# Patient Record
Sex: Female | Born: 1960
Health system: Southern US, Community
[De-identification: ages and names within clinical notes are randomized; demographics above are authoritative.]

## PROBLEM LIST (undated history)

## (undated) DIAGNOSIS — J45909 Unspecified asthma, uncomplicated: Secondary | ICD-10-CM

## (undated) DIAGNOSIS — D126 Benign neoplasm of colon, unspecified: Secondary | ICD-10-CM

## (undated) DIAGNOSIS — I639 Cerebral infarction, unspecified: Secondary | ICD-10-CM

## (undated) DIAGNOSIS — M1990 Unspecified osteoarthritis, unspecified site: Secondary | ICD-10-CM

## (undated) DIAGNOSIS — F32A Depression, unspecified: Secondary | ICD-10-CM

## (undated) DIAGNOSIS — F419 Anxiety disorder, unspecified: Secondary | ICD-10-CM

## (undated) HISTORY — DX: Unspecified osteoarthritis, unspecified site: M19.90

## (undated) HISTORY — DX: Cerebral infarction, unspecified: I63.9

## (undated) HISTORY — DX: Unspecified asthma, uncomplicated: J45.909

## (undated) HISTORY — DX: Benign neoplasm of colon, unspecified: D12.6

## (undated) HISTORY — DX: Anxiety disorder, unspecified: F41.9

## (undated) HISTORY — DX: Depression, unspecified: F32.A

---
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
## 1987-06-15 DIAGNOSIS — I639 Cerebral infarction, unspecified: Secondary | ICD-10-CM

## 1987-06-15 HISTORY — DX: Cerebral infarction, unspecified: I63.9

## 1998-03-19 ENCOUNTER — Other Ambulatory Visit: Admission: RE | Admit: 1998-03-19 | Discharge: 1998-03-19 | Payer: Self-pay | Admitting: Obstetrics and Gynecology

## 1999-05-21 ENCOUNTER — Other Ambulatory Visit: Admission: RE | Admit: 1999-05-21 | Discharge: 1999-05-21 | Payer: Self-pay | Admitting: Obstetrics & Gynecology

## 2000-09-28 ENCOUNTER — Other Ambulatory Visit: Admission: RE | Admit: 2000-09-28 | Discharge: 2000-09-28 | Payer: Self-pay | Admitting: Obstetrics & Gynecology

## 2001-06-23 ENCOUNTER — Other Ambulatory Visit: Admission: RE | Admit: 2001-06-23 | Discharge: 2001-06-23 | Payer: Self-pay | Admitting: Obstetrics & Gynecology

## 2002-07-18 ENCOUNTER — Other Ambulatory Visit: Admission: RE | Admit: 2002-07-18 | Discharge: 2002-07-18 | Payer: Self-pay | Admitting: Obstetrics & Gynecology

## 2003-08-22 ENCOUNTER — Other Ambulatory Visit: Admission: RE | Admit: 2003-08-22 | Discharge: 2003-08-22 | Payer: Self-pay | Admitting: Obstetrics & Gynecology

## 2004-09-14 ENCOUNTER — Other Ambulatory Visit: Admission: RE | Admit: 2004-09-14 | Discharge: 2004-09-14 | Payer: Self-pay | Admitting: Obstetrics & Gynecology

## 2007-09-07 ENCOUNTER — Ambulatory Visit: Payer: Self-pay | Admitting: Family Medicine

## 2008-04-20 DIAGNOSIS — K5900 Constipation, unspecified: Secondary | ICD-10-CM | POA: Insufficient documentation

## 2008-05-07 ENCOUNTER — Ambulatory Visit: Payer: Self-pay | Admitting: Family Medicine

## 2008-08-19 ENCOUNTER — Ambulatory Visit: Payer: Self-pay | Admitting: Family Medicine

## 2009-02-18 ENCOUNTER — Ambulatory Visit: Payer: Self-pay | Admitting: Family Medicine

## 2009-06-14 HISTORY — PX: COLONOSCOPY: SHX174

## 2009-06-14 HISTORY — PX: POLYPECTOMY: SHX149

## 2009-10-28 ENCOUNTER — Ambulatory Visit: Payer: Self-pay | Admitting: Family Medicine

## 2010-01-16 DIAGNOSIS — D126 Benign neoplasm of colon, unspecified: Secondary | ICD-10-CM

## 2010-01-16 HISTORY — DX: Benign neoplasm of colon, unspecified: D12.6

## 2010-01-16 LAB — HM COLONOSCOPY

## 2010-10-09 ENCOUNTER — Encounter: Payer: Self-pay | Admitting: Family Medicine

## 2010-10-09 DIAGNOSIS — M7060 Trochanteric bursitis, unspecified hip: Secondary | ICD-10-CM

## 2010-10-09 DIAGNOSIS — J40 Bronchitis, not specified as acute or chronic: Secondary | ICD-10-CM

## 2010-11-03 ENCOUNTER — Ambulatory Visit (INDEPENDENT_AMBULATORY_CARE_PROVIDER_SITE_OTHER): Payer: 59 | Admitting: Family Medicine

## 2010-11-03 ENCOUNTER — Encounter: Payer: Self-pay | Admitting: Family Medicine

## 2010-11-03 VITALS — BP 112/64 | HR 62 | Ht 60.0 in | Wt 137.0 lb

## 2010-11-03 DIAGNOSIS — N200 Calculus of kidney: Secondary | ICD-10-CM | POA: Insufficient documentation

## 2010-11-03 DIAGNOSIS — Z0000 Encounter for general adult medical examination without abnormal findings: Secondary | ICD-10-CM

## 2010-11-03 DIAGNOSIS — Z8669 Personal history of other diseases of the nervous system and sense organs: Secondary | ICD-10-CM

## 2010-11-03 LAB — LIPID PANEL
HDL: 48 mg/dL (ref >39)
LDL Cholesterol: 136 mg/dL — ABNORMAL HIGH (ref 0–99)
Total CHOL/HDL Ratio: 4.2 Ratio
VLDL: 19 mg/dL (ref 0–40)

## 2010-11-03 LAB — CBC WITH DIFFERENTIAL/PLATELET
Basophils Absolute: 0 10*3/uL (ref 0.0–0.1)
Eosinophils Relative: 2 % (ref 0–5)
HCT: 41.1 % (ref 36.0–46.0)
Lymphocytes Relative: 22 % (ref 12–46)
Lymphs Abs: 1.7 10*3/uL (ref 0.7–4.0)
MCV: 94.9 fL (ref 78.0–100.0)
Monocytes Absolute: 0.5 10*3/uL (ref 0.1–1.0)
Monocytes Relative: 7 % (ref 3–12)
RDW: 14 % (ref 11.5–15.5)
WBC: 7.8 10*3/uL (ref 4.0–10.5)

## 2010-11-03 LAB — COMPREHENSIVE METABOLIC PANEL
ALT: 9 U/L (ref 0–35)
CO2: 21 mEq/L (ref 19–32)
Calcium: 9.3 mg/dL (ref 8.4–10.5)
Chloride: 107 mEq/L (ref 96–112)
Creat: 0.66 mg/dL (ref 0.40–1.20)
Glucose, Bld: 83 mg/dL (ref 70–99)
Total Protein: 6.7 g/dL (ref 6.0–8.3)

## 2010-11-03 LAB — POCT URINALYSIS DIPSTICK
Ketones, UA: NEGATIVE
Protein, UA: NEGATIVE
Spec Grav, UA: 1.02

## 2010-11-03 MED ORDER — NADOLOL 40 MG PO TABS: 40.0000 mg | ORAL_TABLET | Freq: Every day | ORAL | Status: DC

## 2010-11-03 MED ORDER — FLUOXETINE HCL 20 MG PO CAPS: 20.0000 mg | ORAL_CAPSULE | Freq: Every day | ORAL | Status: DC

## 2010-11-03 NOTE — Patient Instructions (Signed)
Continue on your present medications. Turn here as needed.

## 2010-11-03 NOTE — Progress Notes (Signed)
  Subjective:    Patient ID: Theresa Mack, female    DOB: [birth year], 50 y.o.   MRN: [id number]  HPI she is here for a complete examination. She does have complains of an itching sensation in both ears. She also has a previous history of CVA there was apparently secondary to a migraine. She was evaluated by Dr. Sandria Manly who placed her on Corgard. She continues to do well on this medication having only 6 migraines per year. She usually does not take anything for them. She also has a history of renal stones but has not had difficulty with that in several years. She recently had some difficulty with her back but with conservative care to get much better. She is followed by her gynecologist. She has had a DEXA scan. She was given Prozac several years ago for depression. At this time she is also having some menstrual irregularity. Her family and social history are recorded.   Review of Systems  Constitutional: Negative.   HENT: Negative.   Eyes: Negative.   Respiratory: Negative.   Cardiovascular: Negative.   Genitourinary: Negative.   Musculoskeletal: Negative.        Objective:   Physical ExamBP 112/64  Pulse 62  Ht 5' (1.524 m)  Wt 137 lb (62.143 kg)  BMI 26.76 kg/m2  General Appearance:    Alert, cooperative, no distress, appears stated age  Head:    Normocephalic, without obvious abnormality, atraumatic  Eyes:    PERRL, conjunctiva/corneas clear, EOM's intact, fundi    benign  Ears:    Normal TM's and external ear canals  Nose:   Nares normal, mucosa normal, no drainage or sinus   tenderness  Throat:   Lips, mucosa, and tongue normal; teeth and gums normal  Neck:   Supple, no lymphadenopathy;  thyroid:  no   enlargement/tenderness/nodules; no carotid   bruit or JVD  Back:    Spine nontender, no curvature, ROM normal, no CVA     tenderness  Lungs:     Clear to auscultation bilaterally without wheezes, rales or     ronchi; respirations unlabored  Chest Wall:    No tenderness or  deformity   Heart:    Regular rate and rhythm, S1 and S2 normal, no murmur, rub   or gallop  Breast Exam:    Deferred to GYN  Abdomen:     Soft, non-tender, nondistended, normoactive bowel sounds,    no masses, no hepatosplenomegaly  Genitalia:    Deferred to GYN     Extremities:   No clubbing, cyanosis or edema  Pulses:   2+ and symmetric all extremities  Skin:   Skin color, texture, turgor normal, no rashes or lesions  Lymph nodes:   Cervical, supraclavicular, and axillary nodes normal  Neurologic:   CNII-XII intact, normal strength, sensation and gait; reflexes 2+ and symmetric throughout          Psych:   Normal mood, affect, hygiene and grooming.           Assessment & Plan:  History of migraine with CVA. Renal stones. I will call in her Prozac and continue on Corgard. Routine blood screening. We also discussed routine gynecologic as well as mammogram and DEXA scanning.

## 2010-11-04 LAB — VITAMIN D 25 HYDROXY (VIT D DEFICIENCY, FRACTURES): Vit D, 25-Hydroxy: 65 ng/mL (ref 30–89)

## 2010-11-05 ENCOUNTER — Telehealth: Payer: Self-pay

## 2010-11-05 NOTE — Telephone Encounter (Signed)
Left message that lab work was all normal

## 2011-08-02 ENCOUNTER — Ambulatory Visit (INDEPENDENT_AMBULATORY_CARE_PROVIDER_SITE_OTHER): Payer: 59 | Admitting: Family Medicine

## 2011-08-02 ENCOUNTER — Encounter: Payer: Self-pay | Admitting: Family Medicine

## 2011-08-02 VITALS — BP 110/70 | HR 55 | Ht 60.0 in | Wt 136.0 lb

## 2011-08-02 DIAGNOSIS — N390 Urinary tract infection, site not specified: Secondary | ICD-10-CM

## 2011-08-02 DIAGNOSIS — R3915 Urgency of urination: Secondary | ICD-10-CM

## 2011-08-02 LAB — POCT URINALYSIS DIPSTICK
Blood, UA: NEGATIVE
Glucose, UA: NEGATIVE
Nitrite, UA: NEGATIVE
Urobilinogen, UA: NEGATIVE
pH, UA: 6

## 2011-08-02 MED ORDER — SULFAMETHOXAZOLE-TMP DS 800-160 MG PO TABS: 1.0000 | ORAL_TABLET | Freq: Two times a day (BID) | ORAL | Status: AC

## 2011-08-02 NOTE — Patient Instructions (Signed)
Go ahead and take the 3 days of the antibiotic,you can also use Azo-Standard. It will make her urine turn orange.

## 2011-08-02 NOTE — Progress Notes (Signed)
  Subjective:    Patient ID: Theresa Mack, female    DOB: 06/26/1960, 51 y.o.   MRN: [id number]  HPI She complains of a several day history of urinary urgency and slight dysuria but no fever, chills, abdominal pain.  Review of Systems     Objective:   Physical Exam Alert and in no distress. Urine microscopic was contaminated but did show white cells and bacteria      Assessment & Plan:   1. Urgency of urination  POCT Urinalysis Dipstick  2. UTI (lower urinary tract infection)     I will treat her with 3 days of Septra. Also recommend Azo-Standard. She will call if continued difficulty

## 2011-11-11 ENCOUNTER — Ambulatory Visit (INDEPENDENT_AMBULATORY_CARE_PROVIDER_SITE_OTHER): Payer: 59 | Admitting: Family Medicine

## 2011-11-11 ENCOUNTER — Encounter: Payer: Self-pay | Admitting: Family Medicine

## 2011-11-11 VITALS — BP 116/70 | HR 62 | Resp 14 | Ht 60.0 in | Wt 136.0 lb

## 2011-11-11 DIAGNOSIS — Z8673 Personal history of transient ischemic attack (TIA), and cerebral infarction without residual deficits: Secondary | ICD-10-CM

## 2011-11-11 DIAGNOSIS — N943 Premenstrual tension syndrome: Secondary | ICD-10-CM

## 2011-11-11 DIAGNOSIS — Z0000 Encounter for general adult medical examination without abnormal findings: Secondary | ICD-10-CM

## 2011-11-11 DIAGNOSIS — F3281 Premenstrual dysphoric disorder: Secondary | ICD-10-CM

## 2011-11-11 DIAGNOSIS — F341 Dysthymic disorder: Secondary | ICD-10-CM | POA: Insufficient documentation

## 2011-11-11 DIAGNOSIS — Z8669 Personal history of other diseases of the nervous system and sense organs: Secondary | ICD-10-CM

## 2011-11-11 LAB — CBC WITH DIFFERENTIAL/PLATELET
Basophils Absolute: 0 10*3/uL (ref 0.0–0.1)
Basophils Relative: 0 % (ref 0–1)
Eosinophils Absolute: 0.2 10*3/uL (ref 0.0–0.7)
Eosinophils Relative: 3 % (ref 0–5)
HCT: 40.7 % (ref 36.0–46.0)
Hemoglobin: 14.1 g/dL (ref 12.0–15.0)
MCH: 33.3 pg (ref 26.0–34.0)
MCHC: 34.6 g/dL (ref 30.0–36.0)
MCV: 96 fL (ref 78.0–100.0)
Monocytes Absolute: 0.4 10*3/uL (ref 0.1–1.0)
Monocytes Relative: 8 % (ref 3–12)
Neutro Abs: 3.6 10*3/uL (ref 1.7–7.7)
RDW: 14 % (ref 11.5–15.5)

## 2011-11-11 LAB — COMPREHENSIVE METABOLIC PANEL
AST: 17 U/L (ref 0–37)
Albumin: 4.1 g/dL (ref 3.5–5.2)
Alkaline Phosphatase: 59 U/L (ref 39–117)
BUN: 10 mg/dL (ref 6–23)
Creat: 0.61 mg/dL (ref 0.50–1.10)
Glucose, Bld: 85 mg/dL (ref 70–99)
Potassium: 4.4 mEq/L (ref 3.5–5.3)

## 2011-11-11 LAB — POCT URINALYSIS DIPSTICK
Blood, UA: NEGATIVE
Ketones, UA: NEGATIVE
Protein, UA: NEGATIVE
Spec Grav, UA: 1.01
Urobilinogen, UA: NEGATIVE
pH, UA: 8

## 2011-11-11 LAB — LIPID PANEL
HDL: 48 mg/dL (ref >39)
LDL Cholesterol: 126 mg/dL — ABNORMAL HIGH (ref 0–99)
Total CHOL/HDL Ratio: 4.1 Ratio
Triglycerides: 117 mg/dL (ref <150)

## 2011-11-11 MED ORDER — FLUOXETINE HCL 20 MG PO CAPS: 20.0000 mg | ORAL_CAPSULE | Freq: Every day | ORAL | Status: DC

## 2011-11-11 MED ORDER — NADOLOL 40 MG PO TABS: 40.0000 mg | ORAL_TABLET | Freq: Every day | ORAL | Status: DC

## 2011-11-11 NOTE — Progress Notes (Signed)
Subjective:    Patient ID: Theresa Mack, female    DOB: Jun 27, 1960, 51 y.o.   MRN: [id number]  HPI She is here for complete examination. She does get routine gynecologic evaluation. She has had a mammogram, Pap smear and DEXA scan all done in August of last year. Her main risk factor for DEXA is postmenopausal and smoking. Apparently the test was negative. At this time she is not ready to quit. She does have a previous history of CVA and does have questions concerning HRT. She continues to do well on Prozac. She continues on her Corgard and is having no difficulty with this. She does exercise several times per week. Social and family history were reviewed.   Review of Systems  Constitutional: Negative.   HENT: Negative.   Eyes: Negative.   Respiratory: Negative.   Cardiovascular: Negative.   Gastrointestinal: Negative.   Genitourinary: Negative.   Musculoskeletal: Negative.   Skin: Negative.   Neurological: Negative.   Hematological: Negative.   Psychiatric/Behavioral: Negative.        Objective:   Physical Exam BP 116/70  Pulse 62  Resp 14  Ht 5' (1.524 m)  Wt 136 lb (61.689 kg)  BMI 26.56 kg/m2  SpO2 98%  General Appearance:    Alert, cooperative, no distress, appears stated age  Head:    Normocephalic, without obvious abnormality, atraumatic  Eyes:    PERRL, conjunctiva/corneas clear, EOM's intact, fundi    benign  Ears:    Normal TM's and external ear canals  Nose:   Nares normal, mucosa normal, no drainage or sinus   tenderness  Throat:   Lips, mucosa, and tongue normal; teeth and gums normal  Neck:   Supple, no lymphadenopathy;  thyroid:  no   enlargement/tenderness/nodules; no carotid   bruit or JVD  Back:    Spine nontender, no curvature, ROM normal, no CVA     tenderness  Lungs:     Clear to auscultation bilaterally without wheezes, rales or     ronchi; respirations unlabored  Chest Wall:    No tenderness or deformity   Heart:    Regular rate and rhythm, S1  and S2 normal, no murmur, rub   or gallop  Breast Exam:    Deferred to GYN  Abdomen:     Soft, non-tender, nondistended, normoactive bowel sounds,    no masses, no hepatosplenomegaly  Genitalia:    Deferred to GYN     Extremities:   No clubbing, cyanosis or edema  Pulses:   2+ and symmetric all extremities  Skin:   Skin color, texture, turgor normal, no rashes or lesions  Lymph nodes:   Cervical, supraclavicular, and axillary nodes normal  Neurologic:   CNII-XII intact, normal strength, sensation and gait; reflexes 2+ and symmetric throughout          Psych:   Normal mood, affect, hygiene and grooming.         Assessment & Plan:   1. Routine general medical examination at a health care facility  POCT Urinalysis Dipstick, Vitamin D 25 hydroxy, CBC with Differential, Comprehensive metabolic panel, Lipid panel  2. PMDD (premenstrual dysphoric disorder)    3. History of migraine headaches    4. History of CVA (cerebrovascular accident)     discussed HRT with her and recommended against this. Also discussed [place] public health service task force recommendations concerning Pap smear, mammogram, DEXA scan. Encouraged her to continue with physical activities and increase it. Her medications were renewed. I  will check a vitamin D level since she is on fairly good doses of this.

## 2011-11-12 LAB — VITAMIN D 25 HYDROXY (VIT D DEFICIENCY, FRACTURES): Vit D, 25-Hydroxy: 50 ng/mL (ref 30–89)

## 2011-11-13 ENCOUNTER — Other Ambulatory Visit: Payer: Self-pay | Admitting: Family Medicine

## 2011-11-15 NOTE — Telephone Encounter (Signed)
Is this ok?

## 2012-01-13 LAB — HM MAMMOGRAPHY

## 2012-01-13 LAB — HM PAP SMEAR

## 2012-01-13 LAB — HM DEXA SCAN

## 2012-02-10 LAB — HM DEXA SCAN: HM Dexa Scan: NORMAL

## 2012-05-18 ENCOUNTER — Encounter: Payer: Self-pay | Admitting: Family Medicine

## 2012-05-18 ENCOUNTER — Ambulatory Visit (INDEPENDENT_AMBULATORY_CARE_PROVIDER_SITE_OTHER): Payer: 59 | Admitting: Family Medicine

## 2012-05-18 VITALS — BP 100/70 | HR 62 | Temp 98.5°F | Resp 16 | Wt 140.0 lb

## 2012-05-18 DIAGNOSIS — Z8673 Personal history of transient ischemic attack (TIA), and cerebral infarction without residual deficits: Secondary | ICD-10-CM

## 2012-05-18 DIAGNOSIS — N943 Premenstrual tension syndrome: Secondary | ICD-10-CM

## 2012-05-18 DIAGNOSIS — F3281 Premenstrual dysphoric disorder: Secondary | ICD-10-CM

## 2012-05-18 DIAGNOSIS — Z8669 Personal history of other diseases of the nervous system and sense organs: Secondary | ICD-10-CM

## 2012-05-18 NOTE — Patient Instructions (Signed)
Take 4 ibuprofen 3 times a day. Take 2 Prilosec daily to help with stomach irritation. Do this for the next week unless you get worse and if you do call me

## 2012-05-18 NOTE — Progress Notes (Signed)
  Subjective:    Patient ID: Theresa Mack, female    DOB: [birth year], 51 y.o.   MRN: [id number]  HPI 8 days ago she noted the onset of sharp right-sided headache that she describes as stabbing and only lasting a second or 2. It was initially in the temporal area and now right occipital area. No fever but she does have chills. No cough, congestion, sore throat or earache rash,. She has a previous history of migraine-induced stroke and has seen Dr. Sandria Manly in 1989 and the symptoms are not the same. She also has a history most consistent with menstrual migraines; you'll get a headache right at the start of flow. The headache is in the same area as when she had her migraine related CVA.Marland Kitchen She is on her cycle right now but 4 or 5 days into this. Her menses have been irregular the last year and a half The headaches actually started 2 days prior to her menses.   Review of Systems     Objective:   Physical Exam alert and in no distress. EOMI. Other cranial nerves grossly intact. DTRs normal. No tenderness over temporal artery Tympanic membranes and canals are normal. Throat is clear. Tonsils are normal. Neck is supple without adenopathy or thyromegaly. Cardiac exam shows a regular sinus rhythm without murmurs or gallops. Lungs are clear to auscultation.        Assessment & Plan:   1. History of migraine headaches   2. PMDD (premenstrual dysphoric disorder)   3. History of CVA (cerebrovascular accident)    the history at least partially sounds like this could be migraine related that the shooting pain characteristic is unusual. I will treat her initially with higher doses of an NSAID. Did discuss trying a trip planned to see if this would help. She also has difficulty with GI irritation from the NSAID I therefore recommended a PPI.

## 2012-06-05 ENCOUNTER — Telehealth: Payer: Self-pay | Admitting: Family Medicine

## 2012-06-05 MED ORDER — BENZONATATE 100 MG PO CAPS: 100.0000 mg | ORAL_CAPSULE | Freq: Two times a day (BID) | ORAL | Status: DC | PRN

## 2012-06-05 MED ORDER — BENZONATATE 100 MG PO CAPS: 100.0000 mg | ORAL_CAPSULE | Freq: Three times a day (TID) | ORAL | Status: DC | PRN

## 2012-06-05 NOTE — Telephone Encounter (Signed)
Call in Tessalon Perles 1 tid prn for her and let her if this continues, set up an appointment

## 2012-06-05 NOTE — Telephone Encounter (Signed)
Pt informed

## 2012-10-26 ENCOUNTER — Encounter: Payer: Self-pay | Admitting: Family Medicine

## 2012-11-13 ENCOUNTER — Other Ambulatory Visit: Payer: Self-pay

## 2012-11-13 ENCOUNTER — Ambulatory Visit (INDEPENDENT_AMBULATORY_CARE_PROVIDER_SITE_OTHER): Payer: 59 | Admitting: Family Medicine

## 2012-11-13 ENCOUNTER — Encounter: Payer: Self-pay | Admitting: Family Medicine

## 2012-11-13 VITALS — BP 114/70 | HR 62 | Ht 59.5 in | Wt 144.0 lb

## 2012-11-13 DIAGNOSIS — Z8669 Personal history of other diseases of the nervous system and sense organs: Secondary | ICD-10-CM

## 2012-11-13 DIAGNOSIS — Z8673 Personal history of transient ischemic attack (TIA), and cerebral infarction without residual deficits: Secondary | ICD-10-CM

## 2012-11-13 DIAGNOSIS — N943 Premenstrual tension syndrome: Secondary | ICD-10-CM

## 2012-11-13 DIAGNOSIS — F17200 Nicotine dependence, unspecified, uncomplicated: Secondary | ICD-10-CM

## 2012-11-13 DIAGNOSIS — Z87891 Personal history of nicotine dependence: Secondary | ICD-10-CM | POA: Insufficient documentation

## 2012-11-13 DIAGNOSIS — Z0000 Encounter for general adult medical examination without abnormal findings: Secondary | ICD-10-CM

## 2012-11-13 DIAGNOSIS — N200 Calculus of kidney: Secondary | ICD-10-CM

## 2012-11-13 DIAGNOSIS — F3281 Premenstrual dysphoric disorder: Secondary | ICD-10-CM

## 2012-11-13 LAB — CBC WITH DIFFERENTIAL/PLATELET
Basophils Relative: 0 % (ref 0–1)
Eosinophils Absolute: 0.2 10*3/uL (ref 0.0–0.7)
Lymphs Abs: 1.6 10*3/uL (ref 0.7–4.0)
MCH: 33.6 pg (ref 26.0–34.0)
MCHC: 34.9 g/dL (ref 30.0–36.0)
Neutro Abs: 4.9 10*3/uL (ref 1.7–7.7)
Neutrophils Relative %: 68 % (ref 43–77)
Platelets: 264 10*3/uL (ref 150–400)
RBC: 4.7 MIL/uL (ref 3.87–5.11)

## 2012-11-13 LAB — COMPREHENSIVE METABOLIC PANEL
ALT: 15 U/L (ref 0–35)
Alkaline Phosphatase: 57 U/L (ref 39–117)
Potassium: 4.3 mEq/L (ref 3.5–5.3)
Sodium: 140 mEq/L (ref 135–145)
Total Bilirubin: 0.9 mg/dL (ref 0.3–1.2)
Total Protein: 6.9 g/dL (ref 6.0–8.3)

## 2012-11-13 LAB — POCT URINALYSIS DIPSTICK
Bilirubin, UA: NEGATIVE
Blood, UA: NEGATIVE
Glucose, UA: NEGATIVE
Spec Grav, UA: 1.02
Urobilinogen, UA: NEGATIVE

## 2012-11-13 LAB — LIPID PANEL
LDL Cholesterol: 143 mg/dL — ABNORMAL HIGH (ref 0–99)
VLDL: 26 mg/dL (ref 0–40)

## 2012-11-13 MED ORDER — FLUOXETINE HCL 20 MG PO CAPS: 20.0000 mg | ORAL_CAPSULE | Freq: Every day | ORAL | Status: DC

## 2012-11-13 MED ORDER — NADOLOL 40 MG PO TABS: 40.0000 mg | ORAL_TABLET | Freq: Every day | ORAL | Status: DC

## 2012-11-13 NOTE — Telephone Encounter (Signed)
HAD TO CORRECT PHARMACY

## 2012-11-13 NOTE — Progress Notes (Signed)
Subjective:    Patient ID: Theresa Mack, female    DOB: 06-12-1961, 52 y.o.   MRN: [id number]  HPI She is here for a complete examination. She has enjoyed excellent health. She gets regular gynecologic checkups. She is up-to-date on her immunizations as well as general health maintenance issues. Presently she is on Prozac which is apparently helping with perimenopausal symptoms. She continues on Corgard which is being used for her migraines. She apparently did have a CVA related to her migraines. She does complain of snoring but has no underlying allergies, drinks very rarely has had no difficulty with daytime sleepiness, falling asleep behind the wheel or in meetings.She continues to smoke and enjoys smoking. Her work and home life are going quite well.   Review of Systems  Constitutional: Negative.   HENT: Negative.   Eyes: Negative.   Respiratory: Negative.   Cardiovascular: Negative.   Gastrointestinal: Negative.   Endocrine: Negative.   Genitourinary: Negative.   Musculoskeletal: Negative.   Skin: Negative.   Allergic/Immunologic: Negative.   Neurological: Negative.   Hematological: Negative.   Psychiatric/Behavioral: Negative.        Objective:   Physical Exam BP 114/70  Pulse 62  Ht 4' 11.5" (1.511 m)  Wt 144 lb (65.318 kg)  BMI 28.61 kg/m2  General Appearance:    Alert, cooperative, no distress, appears stated age  Head:    Normocephalic, without obvious abnormality, atraumatic  Eyes:    PERRL, conjunctiva/corneas clear, EOM's intact, fundi    benign  Ears:    Normal TM's and external ear canals  Nose:   Nares normal, mucosa normal, no drainage or sinus   tenderness  Throat:   Lips, mucosa, and tongue normal; teeth and gums normal  Neck:   Supple, no lymphadenopathy;  thyroid:  no   enlargement/tenderness/nodules; no carotid   bruit or JVD  Back:    Spine nontender, no curvature, ROM normal, no CVA     tenderness  Lungs:     Clear to auscultation bilaterally  without wheezes, rales or     ronchi; respirations unlabored  Chest Wall:    No tenderness or deformity   Heart:    Regular rate and rhythm, S1 and S2 normal, no murmur, rub   or gallop  Breast Exam:    Deferred to GYN  Abdomen:     Soft, non-tender, nondistended, normoactive bowel sounds,    no masses, no hepatosplenomegaly  Genitalia:    Deferred to GYN     Extremities:   No clubbing, cyanosis or edema  Pulses:   2+ and symmetric all extremities  Skin:   Skin color, texture, turgor normal, no rashes or lesions  Lymph nodes:   Cervical, supraclavicular, and axillary nodes normal  Neurologic:   CNII-XII intact, normal strength, sensation and gait; reflexes 2+ and symmetric throughout          Psych:   Normal mood, affect, hygiene and grooming.          Assessment & Plan:  Routine general medical examination at a health care facility - Plan: POCT urinalysis dipstick, CBC with Differential, Comprehensive metabolic panel, Lipid panel  History of migraine headaches - Plan: nadolol (CORGARD) 40 MG tablet  PMDD (premenstrual dysphoric disorder)  Calcium oxalate renal stones  History of CVA (cerebrovascular accident)  Current smoker she is not interested in quitting smoking now. I will continue her on her present medications. Discussed possibly stopping the Prozac possibly in 6 months two-year. At this  time I think she is probably in the perimenopausal range since her last period was December.

## 2012-11-14 ENCOUNTER — Other Ambulatory Visit: Payer: Self-pay

## 2012-11-14 ENCOUNTER — Encounter: Payer: Self-pay | Admitting: Family Medicine

## 2012-11-14 DIAGNOSIS — Z8669 Personal history of other diseases of the nervous system and sense organs: Secondary | ICD-10-CM

## 2012-11-14 MED ORDER — FLUOXETINE HCL 20 MG PO CAPS: 20.0000 mg | ORAL_CAPSULE | Freq: Every day | ORAL | Status: DC

## 2012-11-14 MED ORDER — NADOLOL 40 MG PO TABS: 40.0000 mg | ORAL_TABLET | Freq: Every day | ORAL | Status: DC

## 2012-11-14 NOTE — Progress Notes (Signed)
Quick Note:  i have mailed pt letter along with cholesterol diet ______

## 2012-12-01 ENCOUNTER — Other Ambulatory Visit: Payer: Self-pay | Admitting: Family Medicine

## 2012-12-01 DIAGNOSIS — Z8669 Personal history of other diseases of the nervous system and sense organs: Secondary | ICD-10-CM

## 2012-12-01 MED ORDER — NADOLOL 40 MG PO TABS: 40.0000 mg | ORAL_TABLET | Freq: Every day | ORAL | Status: DC

## 2013-09-17 ENCOUNTER — Other Ambulatory Visit: Payer: Self-pay | Admitting: Family Medicine

## 2013-09-18 NOTE — Telephone Encounter (Signed)
Is this ok to refill?  

## 2013-12-03 ENCOUNTER — Encounter: Payer: 59 | Admitting: Family Medicine

## 2013-12-06 ENCOUNTER — Encounter: Payer: Self-pay | Admitting: Family Medicine

## 2013-12-06 ENCOUNTER — Ambulatory Visit (INDEPENDENT_AMBULATORY_CARE_PROVIDER_SITE_OTHER): Payer: 59 | Admitting: Family Medicine

## 2013-12-06 VITALS — BP 100/70 | HR 56 | Ht 60.0 in | Wt 138.0 lb

## 2013-12-06 DIAGNOSIS — Z8673 Personal history of transient ischemic attack (TIA), and cerebral infarction without residual deficits: Secondary | ICD-10-CM

## 2013-12-06 DIAGNOSIS — F3281 Premenstrual dysphoric disorder: Secondary | ICD-10-CM

## 2013-12-06 DIAGNOSIS — Z0000 Encounter for general adult medical examination without abnormal findings: Secondary | ICD-10-CM

## 2013-12-06 DIAGNOSIS — N943 Premenstrual tension syndrome: Secondary | ICD-10-CM

## 2013-12-06 DIAGNOSIS — F17200 Nicotine dependence, unspecified, uncomplicated: Secondary | ICD-10-CM

## 2013-12-06 DIAGNOSIS — R160 Hepatomegaly, not elsewhere classified: Secondary | ICD-10-CM

## 2013-12-06 DIAGNOSIS — Z23 Encounter for immunization: Secondary | ICD-10-CM

## 2013-12-06 DIAGNOSIS — Z8669 Personal history of other diseases of the nervous system and sense organs: Secondary | ICD-10-CM

## 2013-12-06 LAB — POCT URINALYSIS DIPSTICK
BILIRUBIN UA: NEGATIVE
Blood, UA: NEGATIVE
GLUCOSE UA: NEGATIVE
Ketones, UA: NEGATIVE
LEUKOCYTES UA: NEGATIVE
NITRITE UA: NEGATIVE
PH UA: 7
Spec Grav, UA: <=1.005
Urobilinogen, UA: NEGATIVE

## 2013-12-06 NOTE — Progress Notes (Signed)
   Subjective:    Patient ID: Theresa Mack, female    DOB: Nov 10, 1960, 53 y.o.   MRN: [id number]  HPI He is here for complete examination. She did have another menstrual cycle approximately 3 months ago. She had a similar issue approximately one year ago and apparently did have a D&C which was negative. She is scheduled to see her gynecologist in the near future. She continues to well on her present medication regimen. She has not had a migraine recently. She does have previous history of CVA related to the migraine. She continues to smoke and is not interested in quitting. She continues on Prozac and we'll discuss this further with her gynecologist. She has no other concerns or complaints. Her marriage and work are going quite well. Family and social history were reviewed.   Review of Systems  All other systems reviewed and are negative.      Objective:   Physical Exam BP 100/70  Pulse 56  Ht 5' (1.524 m)  Wt 138 lb (62.596 kg)  BMI 26.95 kg/m2  General Appearance:    Alert, cooperative, no distress, appears stated age  Head:    Normocephalic, without obvious abnormality, atraumatic  Eyes:    PERRL, conjunctiva/corneas clear, EOM's intact, fundi    benign  Ears:    Normal TM's and external ear canals  Nose:   Nares normal, mucosa normal, no  drainage or sinus   tenderness  Throat:   Lips, mucosa, and tongue normal; teeth and gums normal  Neck:   Supple, no lymphadenopathy;  thyroid:  no   enlargement/tenderness/nodules; no carotid   bruit or JVD  Back:    Spine nontender, no curvature, ROM normal, no CVA     tenderness  Lungs:     Clear to auscultation bilaterally without wheezes, rales or     ronchi; respirations unlabored  Chest Wall:    No tenderness or deformity   Heart:    Regular rate and rhythm, S1 and S2 normal, no murmur, rub   or gallop  Breast Exam:    Deferred to GYN  Abdomen:     Soft, non-tender, nondistended, normoactive bowel sounds,    no masses, hepatomegaly  is noted  Genitalia:    Deferred to GYN     Extremities:   No clubbing, cyanosis or edema  Pulses:   2+ and symmetric all extremities  Skin:   Skin color, texture, turgor normal, no rashes or lesions  Lymph nodes:   Cervical, supraclavicular, and axillary nodes normal  Neurologic:   CNII-XII intact, normal strength, sensation and gait; reflexes 2+ and symmetric throughout          Psych:   Normal mood, affect, hygiene and grooming.          Assessment & Plan:  Routine general medical examination at a health care facility - Plan: Urinalysis Dipstick, CBC with Differential, Comprehensive metabolic panel, Lipid panel  Hepatomegaly - Plan: CBC with Differential, Comprehensive metabolic panel, US Abdomen Limited  Need for Tdap vaccination - Plan: Tdap vaccine greater than or equal to 7yo IM  Current smoker  History of CVA (cerebrovascular accident)  History of migraine headaches  PMDD (premenstrual dysphoric disorder)  She will continue on her present medication regimen. She is scheduled to see her gynecologist. She will discuss continuing on Prozac at that time. At this time she is not ready to quit smoking and therefore did not address this.

## 2013-12-07 ENCOUNTER — Telehealth: Payer: Self-pay | Admitting: Family Medicine

## 2013-12-07 LAB — COMPREHENSIVE METABOLIC PANEL
ALBUMIN: 4.4 g/dL (ref 3.5–5.2)
ALK PHOS: 56 U/L (ref 39–117)
ALT: 8 U/L (ref 0–35)
AST: 16 U/L (ref 0–37)
BUN: 10 mg/dL (ref 6–23)
CO2: 26 mEq/L (ref 19–32)
Calcium: 9.5 mg/dL (ref 8.4–10.5)
Chloride: 104 mEq/L (ref 96–112)
Creat: 0.73 mg/dL (ref 0.50–1.10)
GLUCOSE: 85 mg/dL (ref 70–99)
POTASSIUM: 4.2 meq/L (ref 3.5–5.3)
SODIUM: 138 meq/L (ref 135–145)
TOTAL PROTEIN: 6.9 g/dL (ref 6.0–8.3)
Total Bilirubin: 1.2 mg/dL (ref 0.2–1.2)

## 2013-12-07 LAB — CBC WITH DIFFERENTIAL/PLATELET
BASOS ABS: 0 10*3/uL (ref 0.0–0.1)
BASOS PCT: 0 % (ref 0–1)
Eosinophils Absolute: 0.2 10*3/uL (ref 0.0–0.7)
Eosinophils Relative: 4 % (ref 0–5)
HCT: 40.7 % (ref 36.0–46.0)
HEMOGLOBIN: 14.2 g/dL (ref 12.0–15.0)
LYMPHS PCT: 28 % (ref 12–46)
Lymphs Abs: 1.7 10*3/uL (ref 0.7–4.0)
MCH: 32.7 pg (ref 26.0–34.0)
MCHC: 34.9 g/dL (ref 30.0–36.0)
MCV: 93.8 fL (ref 78.0–100.0)
MONOS PCT: 6 % (ref 3–12)
Monocytes Absolute: 0.4 10*3/uL (ref 0.1–1.0)
NEUTROS ABS: 3.8 10*3/uL (ref 1.7–7.7)
NEUTROS PCT: 62 % (ref 43–77)
Platelets: 325 10*3/uL (ref 150–400)
RBC: 4.34 MIL/uL (ref 3.87–5.11)
RDW: 13.5 % (ref 11.5–15.5)
WBC: 6.1 10*3/uL (ref 4.0–10.5)

## 2013-12-07 LAB — LIPID PANEL
Cholesterol: 220 mg/dL — ABNORMAL HIGH (ref 0–200)
HDL: 54 mg/dL (ref >39)
LDL CALC: 148 mg/dL — AB (ref 0–99)
Total CHOL/HDL Ratio: 4.1 Ratio
Triglycerides: 90 mg/dL (ref <150)
VLDL: 18 mg/dL (ref 0–40)

## 2013-12-07 MED ORDER — FLUOXETINE HCL 20 MG PO CAPS: 20.0000 mg | ORAL_CAPSULE | Freq: Every day | ORAL | Status: DC

## 2013-12-07 NOTE — Telephone Encounter (Signed)
prozac renewed

## 2013-12-11 ENCOUNTER — Other Ambulatory Visit: Payer: 59

## 2013-12-17 ENCOUNTER — Telehealth: Payer: Self-pay | Admitting: Family Medicine

## 2013-12-17 ENCOUNTER — Other Ambulatory Visit: Payer: Self-pay

## 2013-12-17 MED ORDER — FLUOXETINE HCL 20 MG PO CAPS: 20.0000 mg | ORAL_CAPSULE | Freq: Every day | ORAL | Status: DC

## 2013-12-17 NOTE — Telephone Encounter (Signed)
CALLED [facility] CANCELED PROZAC AND MAILED A RX FROM [facility] TO HER HOME ADDRESS

## 2013-12-17 NOTE — Telephone Encounter (Signed)
Pt called and stated that her prozac was to be sent to a local [facility] because it is a lot less expensive there. It was sent to her mail order pharmacy in error. Pt would like written rx's mailed to her house for the duration of refill. She wants to rest of the refill cancelled at mail order rx.

## 2013-12-17 NOTE — Telephone Encounter (Signed)
Cancel the one at her [place] and send the prescription to her.

## 2013-12-20 ENCOUNTER — Ambulatory Visit
Admission: RE | Admit: 2013-12-20 | Discharge: 2013-12-20 | Disposition: A | Discharge status: Home / Self Care | Payer: 59 | Source: Ambulatory Visit | Attending: Family Medicine | Admitting: Family Medicine

## 2014-02-18 ENCOUNTER — Other Ambulatory Visit: Payer: Self-pay | Admitting: Family Medicine

## 2014-02-19 ENCOUNTER — Telehealth: Payer: Self-pay | Admitting: Family Medicine

## 2014-02-19 MED ORDER — NADOLOL 40 MG PO TABS: ORAL_TABLET | ORAL | Status: DC

## 2014-02-19 NOTE — Telephone Encounter (Signed)
IS THIS OKAY 

## 2014-02-19 NOTE — Telephone Encounter (Signed)
Pt called for refill of Nadalol she states she takes this every day.  Needs refill to [facility] mail order 90 day supply.  It looks like we have not refilled since 11/2012?

## 2014-02-27 ENCOUNTER — Other Ambulatory Visit: Payer: Self-pay | Admitting: Obstetrics & Gynecology

## 2014-02-27 DIAGNOSIS — R928 Other abnormal and inconclusive findings on diagnostic imaging of breast: Secondary | ICD-10-CM

## 2014-03-06 ENCOUNTER — Ambulatory Visit
Admission: RE | Admit: 2014-03-06 | Discharge: 2014-03-06 | Disposition: A | Discharge status: Home / Self Care | Payer: 59 | Source: Ambulatory Visit | Attending: Obstetrics & Gynecology | Admitting: Obstetrics & Gynecology

## 2014-03-06 DIAGNOSIS — R928 Other abnormal and inconclusive findings on diagnostic imaging of breast: Secondary | ICD-10-CM

## 2014-03-06 LAB — HM PAP SMEAR

## 2014-03-06 LAB — HM MAMMOGRAPHY

## 2014-03-11 ENCOUNTER — Encounter: Payer: Self-pay | Admitting: Internal Medicine

## 2014-09-26 ENCOUNTER — Other Ambulatory Visit: Payer: Self-pay | Admitting: Obstetrics & Gynecology

## 2014-09-26 DIAGNOSIS — N6320 Unspecified lump in the left breast, unspecified quadrant: Secondary | ICD-10-CM

## 2014-10-03 ENCOUNTER — Ambulatory Visit
Admission: RE | Admit: 2014-10-03 | Discharge: 2014-10-03 | Disposition: A | Discharge status: Home / Self Care | Payer: 59 | Source: Ambulatory Visit | Attending: Obstetrics & Gynecology | Admitting: Obstetrics & Gynecology

## 2014-10-03 DIAGNOSIS — N6320 Unspecified lump in the left breast, unspecified quadrant: Secondary | ICD-10-CM

## 2015-01-14 ENCOUNTER — Ambulatory Visit (INDEPENDENT_AMBULATORY_CARE_PROVIDER_SITE_OTHER): Payer: 59 | Admitting: Family Medicine

## 2015-01-14 ENCOUNTER — Encounter: Payer: Self-pay | Admitting: Family Medicine

## 2015-01-14 VITALS — BP 126/80 | HR 58 | Ht 59.5 in | Wt 145.0 lb

## 2015-01-14 DIAGNOSIS — F341 Dysthymic disorder: Secondary | ICD-10-CM

## 2015-01-14 DIAGNOSIS — N200 Calculus of kidney: Secondary | ICD-10-CM | POA: Diagnosis not present

## 2015-01-14 DIAGNOSIS — Z23 Encounter for immunization: Secondary | ICD-10-CM | POA: Diagnosis not present

## 2015-01-14 DIAGNOSIS — Z720 Tobacco use: Secondary | ICD-10-CM | POA: Diagnosis not present

## 2015-01-14 DIAGNOSIS — Z8669 Personal history of other diseases of the nervous system and sense organs: Secondary | ICD-10-CM

## 2015-01-14 DIAGNOSIS — Z0000 Encounter for general adult medical examination without abnormal findings: Secondary | ICD-10-CM

## 2015-01-14 DIAGNOSIS — Z8673 Personal history of transient ischemic attack (TIA), and cerebral infarction without residual deficits: Secondary | ICD-10-CM

## 2015-01-14 DIAGNOSIS — F17200 Nicotine dependence, unspecified, uncomplicated: Secondary | ICD-10-CM

## 2015-01-14 LAB — CBC WITH DIFFERENTIAL/PLATELET
Basophils Absolute: 0 10*3/uL (ref 0.0–0.1)
Basophils Relative: 0 % (ref 0–1)
Eosinophils Absolute: 0.2 10*3/uL (ref 0.0–0.7)
Eosinophils Relative: 4 % (ref 0–5)
HCT: 42.3 % (ref 36.0–46.0)
Hemoglobin: 14.9 g/dL (ref 12.0–15.0)
Lymphocytes Relative: 27 % (ref 12–46)
Lymphs Abs: 1.5 10*3/uL (ref 0.7–4.0)
MCH: 32.5 pg (ref 26.0–34.0)
MCHC: 35.2 g/dL (ref 30.0–36.0)
MCV: 92.2 fL (ref 78.0–100.0)
MONO ABS: 0.3 10*3/uL (ref 0.1–1.0)
MPV: 9 fL (ref 8.6–12.4)
Monocytes Relative: 6 % (ref 3–12)
NEUTROS ABS: 3.5 10*3/uL (ref 1.7–7.7)
NEUTROS PCT: 63 % (ref 43–77)
Platelets: 347 10*3/uL (ref 150–400)
RBC: 4.59 MIL/uL (ref 3.87–5.11)
RDW: 13.5 % (ref 11.5–15.5)
WBC: 5.6 10*3/uL (ref 4.0–10.5)

## 2015-01-14 LAB — POCT URINALYSIS DIPSTICK
Bilirubin, UA: NEGATIVE
Blood, UA: 10
GLUCOSE UA: NEGATIVE
Ketones, UA: NEGATIVE
Leukocytes, UA: NEGATIVE
Nitrite, UA: POSITIVE
Protein, UA: NEGATIVE
Spec Grav, UA: 1.015
UROBILINOGEN UA: NEGATIVE
pH, UA: 6.5

## 2015-01-14 LAB — COMPREHENSIVE METABOLIC PANEL
ALBUMIN: 4.1 g/dL (ref 3.6–5.1)
ALT: 12 U/L (ref 6–29)
AST: 19 U/L (ref 10–35)
Alkaline Phosphatase: 59 U/L (ref 33–130)
BILIRUBIN TOTAL: 0.8 mg/dL (ref 0.2–1.2)
BUN: 8 mg/dL (ref 7–25)
CO2: 24 mmol/L (ref 20–31)
CREATININE: 0.62 mg/dL (ref 0.50–1.05)
Calcium: 9 mg/dL (ref 8.6–10.4)
Chloride: 107 mmol/L (ref 98–110)
GLUCOSE: 77 mg/dL (ref 65–99)
POTASSIUM: 4.2 mmol/L (ref 3.5–5.3)
SODIUM: 140 mmol/L (ref 135–146)
TOTAL PROTEIN: 6.5 g/dL (ref 6.1–8.1)

## 2015-01-14 LAB — LIPID PANEL
Cholesterol: 219 mg/dL — ABNORMAL HIGH (ref 125–200)
HDL: 44 mg/dL — ABNORMAL LOW (ref >=46)
LDL Cholesterol: 148 mg/dL — ABNORMAL HIGH (ref <130)
TRIGLYCERIDES: 136 mg/dL (ref <150)
Total CHOL/HDL Ratio: 5 Ratio (ref <=5.0)
VLDL: 27 mg/dL (ref <30)

## 2015-01-14 MED ORDER — NADOLOL 40 MG PO TABS: ORAL_TABLET | ORAL | Status: DC

## 2015-01-14 MED ORDER — FLUOXETINE HCL 20 MG PO CAPS: 20.0000 mg | ORAL_CAPSULE | Freq: Every day | ORAL | Status: DC

## 2015-01-14 NOTE — Progress Notes (Signed)
Subjective:    Patient ID: Theresa Mack, female    DOB: May 13, 1961, 54 y.o.   MRN: [id number]  HPI She is here for a complete examination. She does note that she stopped having regular cycles approximately 2-3 years ago but yearly since then she has had one cycle. She apparently has been evaluated by GYN and did have a biopsy as well as blood work. She does have an appointment scheduled to evaluate this further. She is also dealing with the possibility of retiring. She does have parents living in [place] as well as grandchildren that she would like to spent more time with him. She continues on Prozac and was placed on this for PMDD. She has left and come off the medicine as it also helps keep her fairly stable. She continues to smoke and is not interested in quitting. She has remote history of CVA but no neurologic symptoms since then. She does have a history of stones and again no trouble recently. Her migraine headaches rarely give her trouble. Family history, health maintenance and immunizations were reviewed. PH Q9 was negative for depression.   Review of Systems     Objective:   Physical Exam BP 126/80 mmHg  Pulse 58  Ht 4' 11.5" (1.511 m)  Wt 145 lb (65.772 kg)  BMI 28.81 kg/m2  SpO2 98% BP 126/80 mmHg  Pulse 58  Ht 4' 11.5" (1.511 m)  Wt 145 lb (65.772 kg)  BMI 28.81 kg/m2  SpO2 98%  General Appearance:    Alert, cooperative, no distress, appears stated age  Head:    Normocephalic, without obvious abnormality, atraumatic  Eyes:    PERRL, conjunctiva/corneas clear, EOM's intact, fundi    benign  Ears:    Normal TM's and external ear canals  Nose:   Nares normal, mucosa normal, no drainage or sinus   tenderness  Throat:   Lips, mucosa, and tongue normal; teeth and gums normal  Neck:   Supple, no lymphadenopathy;  thyroid:  no   enlargement/tenderness/nodules; no carotid   bruit or JVD  Back:    Spine nontender, no curvature, ROM normal, no CVA     tenderness  Lungs:      Clear to auscultation bilaterally without wheezes, rales or     ronchi; respirations unlabored  Chest Wall:    No tenderness or deformity   Heart:    Regular rate and rhythm, S1 and S2 normal, no murmur, rub   or gallop  Breast Exam:    Deferred to GYN  Abdomen:     Soft, non-tender, nondistended, normoactive bowel sounds,    no masses, no hepatosplenomegaly  Genitalia:    Deferred to GYN     Extremities:   No clubbing, cyanosis or edema  Pulses:   2+ and symmetric all extremities  Skin:   Skin color, texture, turgor normal, no rashes or lesions  Lymph nodes:   Cervical, supraclavicular, and axillary nodes normal  Neurologic:   CNII-XII intact, normal strength, sensation and gait; reflexes 2+ and symmetric throughout          Psych:   Normal mood, affect, hygiene and grooming.          Assessment & Plan:  Routine general medical examination at a health care facility - Plan: CBC with Differential/Platelet, Comprehensive metabolic panel, Lipid panel, Vit D  25 hydroxy (rtn osteoporosis monitoring), nadolol (CORGARD) 40 MG tablet, POCT Urinalysis Dipstick  History of migraine headaches  History of CVA (cerebrovascular accident)  Current smoker  Calcium oxalate renal stones - Plan: POCT Urinalysis Dipstick  Need for prophylactic vaccination with combined diphtheria-tetanus-pertussis (DTP) vaccine  Dysthymia She will follow-up with her gynecologist. We talked at length concerning her retirement and I think she is deathly considering doing that. She is not interested in quitting smoking. Her medications will be renewed.

## 2015-01-14 NOTE — Progress Notes (Signed)
   Subjective:    Patient ID: Theresa Mack, female    DOB: Jan 22, 1961, 54 y.o.   MRN: [id number]  HPI    Review of Systems     Objective:   Physical Exam        Assessment & Plan:  Her urinalysis was positive for nitrites and white cells however she is asymptomatic and I will therefore not treat her.

## 2015-01-15 LAB — VITAMIN D 25 HYDROXY (VIT D DEFICIENCY, FRACTURES): Vit D, 25-Hydroxy: 35 ng/mL (ref 30–100)

## 2015-01-28 ENCOUNTER — Telehealth: Payer: Self-pay | Admitting: Family Medicine

## 2015-01-28 NOTE — Telephone Encounter (Signed)
Pt called stating that my chart is telling her she is due for Tdap but she was told she wasn't due and she thought she had it last year.  I had Cheri check also and there is no record of patient receiving this immunization.  I also checked and made sure she wasn't billed for this injection and she wasn't.  I left pt a message to call and schedule for Tdap and I apologized for the confusion.

## 2015-02-19 ENCOUNTER — Other Ambulatory Visit: Payer: Self-pay | Admitting: Obstetrics & Gynecology

## 2015-02-19 DIAGNOSIS — N6320 Unspecified lump in the left breast, unspecified quadrant: Secondary | ICD-10-CM

## 2015-03-31 ENCOUNTER — Other Ambulatory Visit: Payer: 59

## 2015-04-03 ENCOUNTER — Other Ambulatory Visit (INDEPENDENT_AMBULATORY_CARE_PROVIDER_SITE_OTHER): Payer: 59

## 2015-04-03 DIAGNOSIS — Z23 Encounter for immunization: Secondary | ICD-10-CM | POA: Diagnosis not present

## 2015-04-09 ENCOUNTER — Ambulatory Visit
Admission: RE | Admit: 2015-04-09 | Discharge: 2015-04-09 | Disposition: A | Discharge status: Home / Self Care | Payer: 59 | Source: Ambulatory Visit | Attending: Obstetrics & Gynecology | Admitting: Obstetrics & Gynecology

## 2015-04-09 DIAGNOSIS — N6320 Unspecified lump in the left breast, unspecified quadrant: Secondary | ICD-10-CM

## 2015-07-09 ENCOUNTER — Other Ambulatory Visit: Payer: 59

## 2015-11-02 IMAGING — MG MM DIAGNOSTIC UNILATERAL L
5 series · 5 of 5 positions shown · non-contrast
Comparison: 02/25/2014 and earlier

CLINICAL DATA: The patient returns after screening study for
evaluation of possible mass in the left breast.

EXAM:
DIGITAL DIAGNOSTIC  LEFT MAMMOGRAM WITH CAD
ULTRASOUND LEFT BREAST

[L CC (1 of 4)]
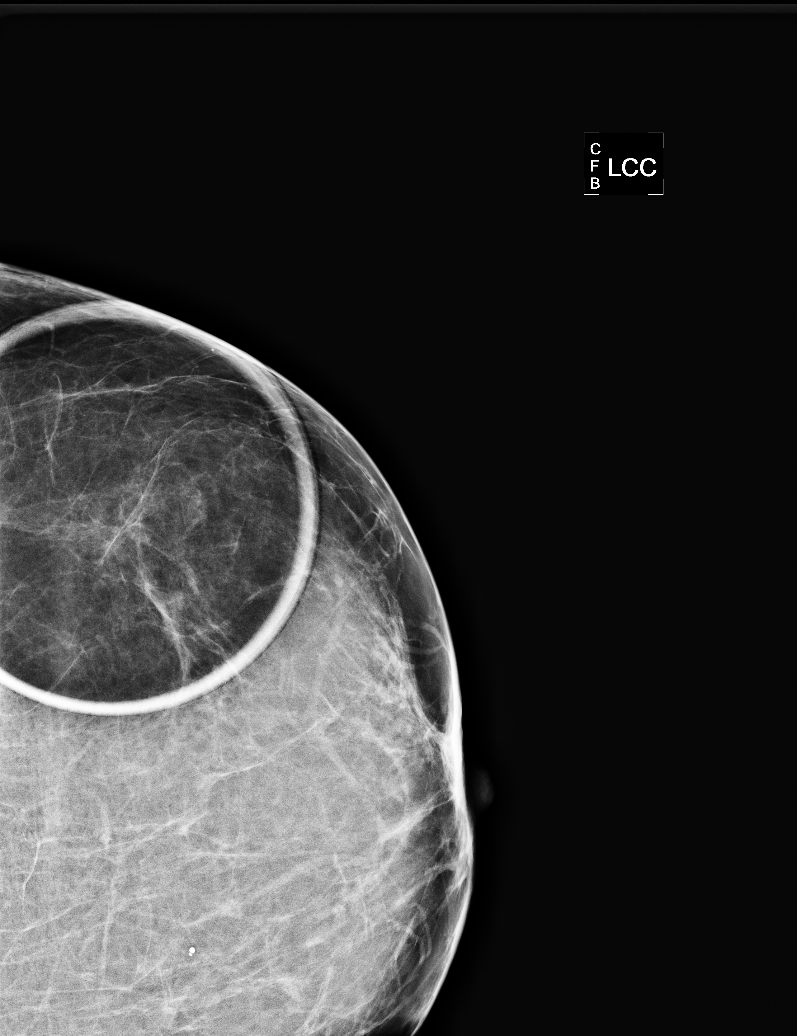

[L MLO]
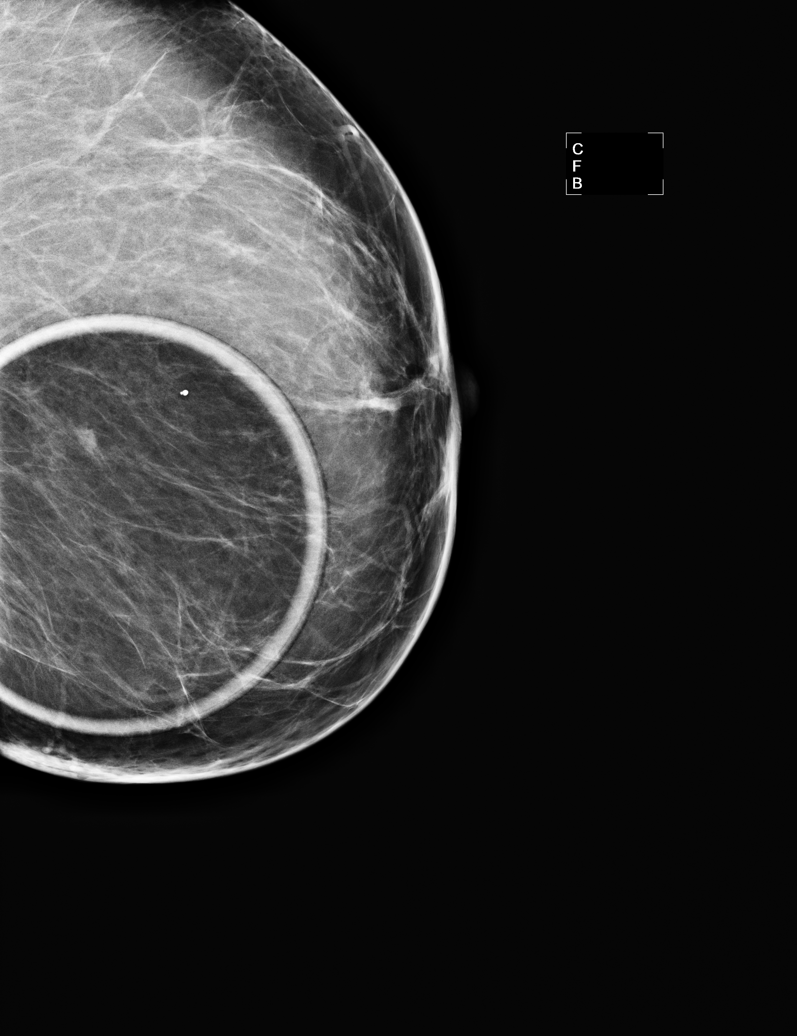

[L CC (2 of 4)]
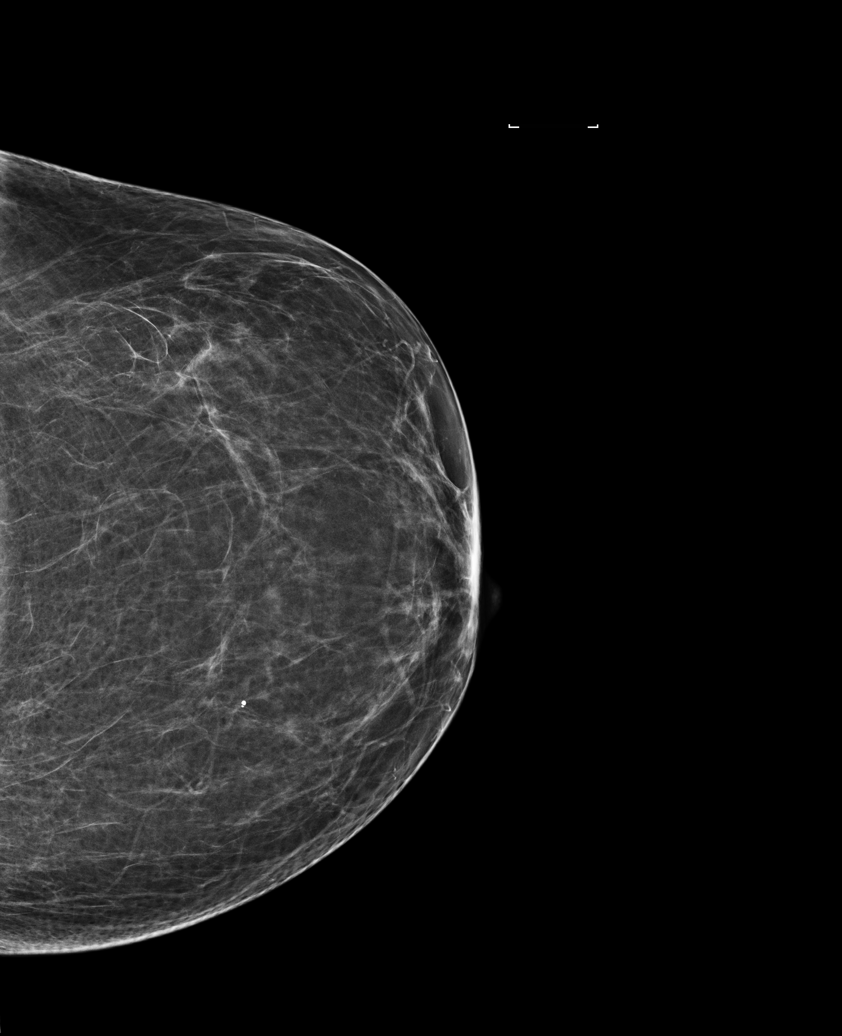

[L CC (3 of 4)]
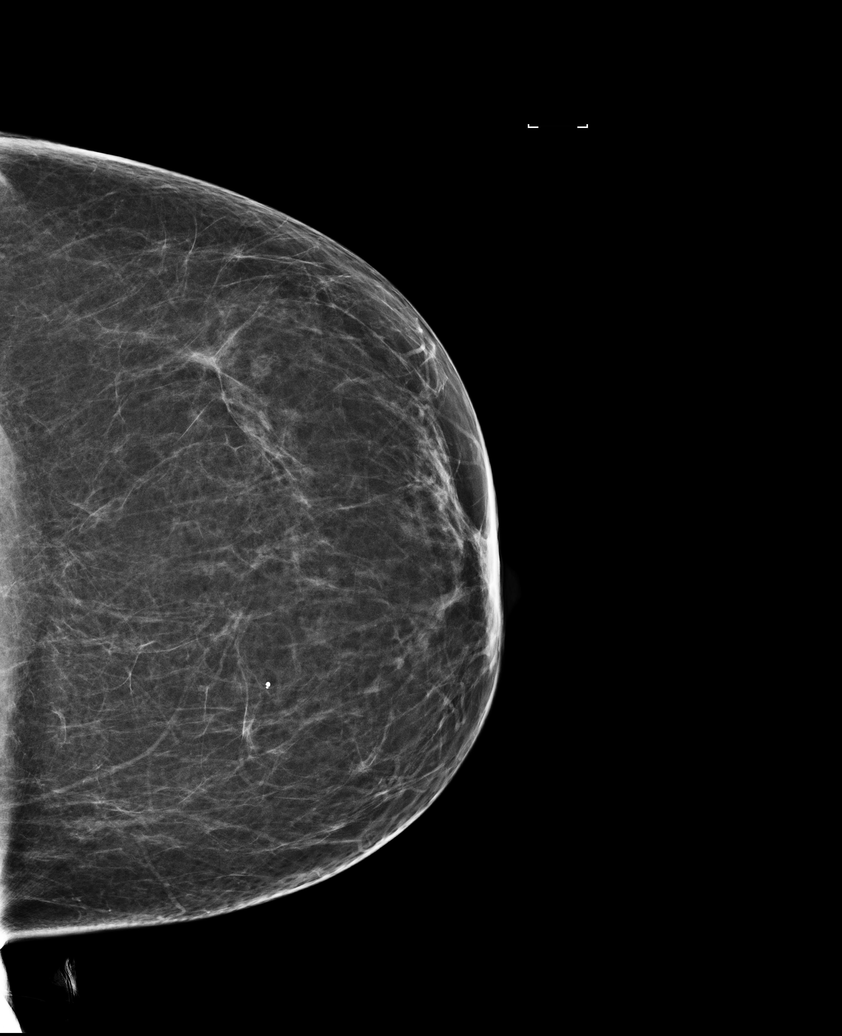

[L CC (4 of 4)]
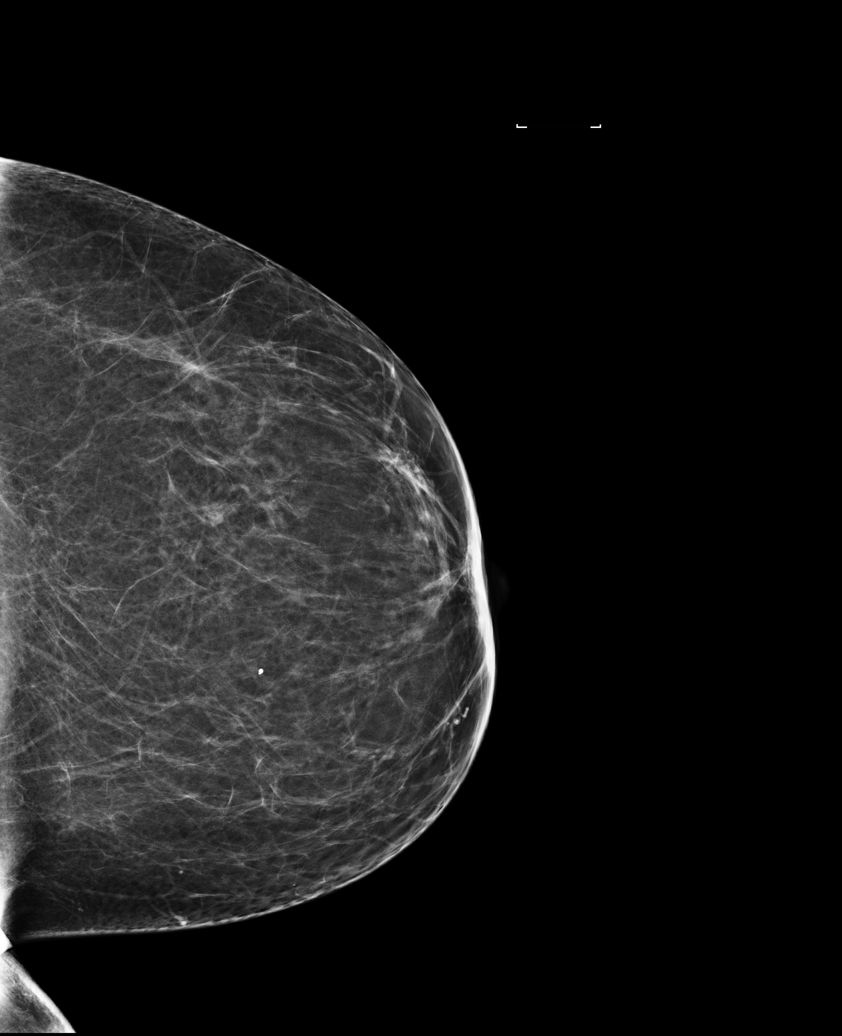

[5 of 5 positions shown; findings below may reference images not displayed]

ACR Breast Density Category b: There are scattered areas of
fibroglandular density.
FINDINGS: Additional views are performed of the left breast, showing no
persistent mass or distortion in the lateral aspect of the breast.

Mammographic images were processed with CAD.

On physical exam, I palpate no abnormality in the lateral quadrants
of the left breast.

Ultrasound is performed, showing normal appearing fibroglandular
tissue throughout the lateral quadrants on the left. Insert clear U
less
IMPRESSION: 1.  No mammographic or ultrasound evidence for malignancy.
2. Asymmetry in the left breast is felt to represent benign breast
parenchyma.
3. Followup is recommended to document stability.

RECOMMENDATION:
Followup left mammogram with possible ultrasound suggested in 6
months.

I have discussed the findings and recommendations with the patient.
Results were also provided in writing at the conclusion of the
visit. If applicable, a reminder letter will be sent to the patient
regarding the next appointment.

BI-RADS CATEGORY  3: Probably benign.

## 2016-01-12 ENCOUNTER — Telehealth: Payer: Self-pay

## 2016-01-12 NOTE — Telephone Encounter (Signed)
Pt request refill of prozac 20mg  to [facility] on [address].

## 2016-01-12 NOTE — Telephone Encounter (Signed)
She needs a med check appointment 

## 2016-01-12 NOTE — Telephone Encounter (Signed)
Left message for pt she needs an appointment med check or cpe to please make the appointment

## 2016-01-19 ENCOUNTER — Encounter: Payer: Self-pay | Admitting: Family Medicine

## 2016-01-19 ENCOUNTER — Other Ambulatory Visit: Payer: Self-pay

## 2016-01-19 ENCOUNTER — Ambulatory Visit (INDEPENDENT_AMBULATORY_CARE_PROVIDER_SITE_OTHER): Payer: 59 | Admitting: Family Medicine

## 2016-01-19 VITALS — BP 112/60 | HR 61 | Ht 60.0 in | Wt 142.0 lb

## 2016-01-19 DIAGNOSIS — Z8669 Personal history of other diseases of the nervous system and sense organs: Secondary | ICD-10-CM

## 2016-01-19 DIAGNOSIS — N200 Calculus of kidney: Secondary | ICD-10-CM

## 2016-01-19 DIAGNOSIS — Z720 Tobacco use: Secondary | ICD-10-CM

## 2016-01-19 DIAGNOSIS — Z8673 Personal history of transient ischemic attack (TIA), and cerebral infarction without residual deficits: Secondary | ICD-10-CM | POA: Diagnosis not present

## 2016-01-19 DIAGNOSIS — Z0000 Encounter for general adult medical examination without abnormal findings: Secondary | ICD-10-CM | POA: Diagnosis not present

## 2016-01-19 DIAGNOSIS — F17200 Nicotine dependence, unspecified, uncomplicated: Secondary | ICD-10-CM

## 2016-01-19 DIAGNOSIS — F341 Dysthymic disorder: Secondary | ICD-10-CM

## 2016-01-19 DIAGNOSIS — Z1159 Encounter for screening for other viral diseases: Secondary | ICD-10-CM

## 2016-01-19 LAB — CBC WITH DIFFERENTIAL/PLATELET
Basophils Absolute: 0 cells/uL (ref 0–200)
Basophils Relative: 0 %
EOS PCT: 4 %
Eosinophils Absolute: 220 cells/uL (ref 15–500)
HCT: 44.9 % (ref 35.0–45.0)
Hemoglobin: 15.6 g/dL — ABNORMAL HIGH (ref 11.7–15.5)
Lymphocytes Relative: 30 %
Lymphs Abs: 1650 cells/uL (ref 850–3900)
MCH: 32.9 pg (ref 27.0–33.0)
MCHC: 34.7 g/dL (ref 32.0–36.0)
MCV: 94.7 fL (ref 80.0–100.0)
MPV: 9.4 fL (ref 7.5–12.5)
Monocytes Absolute: 385 cells/uL (ref 200–950)
Monocytes Relative: 7 %
NEUTROS ABS: 3245 {cells}/uL (ref 1500–7800)
Neutrophils Relative %: 59 %
Platelets: 303 10*3/uL (ref 140–400)
RBC: 4.74 MIL/uL (ref 3.80–5.10)
RDW: 13.4 % (ref 11.0–15.0)
WBC: 5.5 10*3/uL (ref 4.0–10.5)

## 2016-01-19 LAB — COMPREHENSIVE METABOLIC PANEL
ALT: 11 U/L (ref 6–29)
AST: 16 U/L (ref 10–35)
Albumin: 4.3 g/dL (ref 3.6–5.1)
Alkaline Phosphatase: 59 U/L (ref 33–130)
BUN: 11 mg/dL (ref 7–25)
CO2: 26 mmol/L (ref 20–31)
CREATININE: 0.64 mg/dL (ref 0.50–1.05)
Calcium: 9.5 mg/dL (ref 8.6–10.4)
Chloride: 108 mmol/L (ref 98–110)
Glucose, Bld: 81 mg/dL (ref 65–99)
Potassium: 4.7 mmol/L (ref 3.5–5.3)
SODIUM: 142 mmol/L (ref 135–146)
TOTAL PROTEIN: 6.5 g/dL (ref 6.1–8.1)
Total Bilirubin: 1 mg/dL (ref 0.2–1.2)

## 2016-01-19 LAB — POCT URINALYSIS DIPSTICK
Bilirubin, UA: NEGATIVE
Blood, UA: POSITIVE
Glucose, UA: NEGATIVE
Ketones, UA: NEGATIVE
LEUKOCYTES UA: NEGATIVE
Nitrite, UA: POSITIVE
PH UA: 6
PROTEIN UA: NEGATIVE
Spec Grav, UA: >=1.03
UROBILINOGEN UA: NEGATIVE

## 2016-01-19 LAB — LIPID PANEL
CHOL/HDL RATIO: 4.2 ratio (ref <=5.0)
Cholesterol: 211 mg/dL — ABNORMAL HIGH (ref 125–200)
HDL: 50 mg/dL (ref >=46)
LDL Cholesterol: 133 mg/dL — ABNORMAL HIGH (ref <130)
Triglycerides: 138 mg/dL (ref <150)
VLDL: 28 mg/dL (ref <30)

## 2016-01-19 MED ORDER — NADOLOL 40 MG PO TABS: ORAL_TABLET | ORAL | 3 refills | Status: DC

## 2016-01-19 MED ORDER — FLUOXETINE HCL 20 MG PO CAPS: 20.0000 mg | ORAL_CAPSULE | Freq: Every day | ORAL | 3 refills | Status: DC

## 2016-01-19 NOTE — Patient Instructions (Signed)
Pay attention to see if this is related to certain foods, spices, milk or milk products; also look at possibly gluten

## 2016-01-19 NOTE — Progress Notes (Signed)
Subjective:    Patient ID: Theresa Mack, female    DOB: Sep 02, 1960, 55 y.o.   MRN: [id number]  HPI She is here for complete examination. She does complain of intermittent difficulty with loose stools after eating. She cannot relate this to any particular foods,additives, Or other factors. She has seen Dr. Doran Durand for foot problems and apparently he is concerned about her back. She does have a history of migraine headaches and a CVA associated with this. She has had no long-term effects from this specifically cognitive dysfunction or neuropsychiatric issues.Her weight is stable.She has been on Corgard since that time and is doing quite nicely on that. She will have a migraine if she forgets to take it but does state that Excedrin migraine works very well for this. She does occasionally have an aura with this. She continues on Prozac for a previous history of PMDD. She is not interested in stopping this medicine as she feels it does help stabilize her. She does smoke but is not quite ready to totally quit smoking. She will see her gynecologist in the near future. She does have a previous history of renal stones but none recently. Her marriage and home life are going well. Family and social history as well as health maintenance and immunizations were reviewed.   Review of Systems  All other systems reviewed and are negative.      Objective:   Physical Exam BP 112/60 (BP Location: Left Arm, Patient Position: Sitting, Cuff Size: Normal)   Pulse 61   Ht 5' (1.524 m)   Wt 142 lb (64.4 kg)   BMI 27.73 kg/m   General Appearance:    Alert, cooperative, no distress, appears stated age  Head:    Normocephalic, without obvious abnormality, atraumatic  Eyes:    PERRL, conjunctiva/corneas clear, EOM's intact, fundi    benign  Ears:    Normal TM's and external ear canals  Nose:   Nares normal, mucosa normal, no drainage or sinus   tenderness  Throat:   Lips, mucosa, and tongue normal; teeth and gums  normal  Neck:   Supple, no lymphadenopathy;  thyroid:  no   enlargement/tenderness/nodules; no carotid   bruit or JVD  Back:    Spine nontender, no curvature, ROM normal, no CVA     tenderness  Lungs:     Clear to auscultation bilaterally without wheezes, rales or     ronchi; respirations unlabored  Chest Wall:    No tenderness or deformity   Heart:    Regular rate and rhythm, S1 and S2 normal, no murmur, rub   or gallop  Breast Exam:    Deferred to GYN  Abdomen:     Soft, non-tender, nondistended, normoactive bowel sounds,    no masses, no hepatosplenomegaly  Genitalia:    Deferred to GYN     Extremities:   No clubbing, cyanosis or edema  Pulses:   2+ and symmetric all extremities  Skin:   Skin color, texture, turgor normal, no rashes or lesions  Lymph nodes:   Cervical, supraclavicular, and axillary nodes normal  Neurologic:   CNII-XII intact, normal strength, sensation and gait; reflexes 2+ and symmetric throughout          Psych:   Normal mood, affect, hygiene and grooming.   The urine dipstick was positive for blood however the microscopic showed few if any red cells.       Assessment & Plan:  Routine general medical examination at a health  care facility - Plan: POCT Urinalysis Dipstick, CBC with Differential/Platelet, Comprehensive metabolic panel, Lipid panel, DISCONTINUED: nadolol (CORGARD) 40 MG tablet  History of migraine headaches - Plan: DISCONTINUED: nadolol (CORGARD) 40 MG tablet  History of CVA (cerebrovascular accident)  Current smoker  Calcium oxalate renal stones  Dysthymia - Plan: FLUoxetine (PROZAC) 20 MG capsule  Need for hepatitis C screening test - Plan: Hepatitis C antibody She will continue on her present medication regimen. Discussed possibly stopping the Prozac and how to do this. Did however not recommend stopping the Prozac and quitting smoking at the same time. She will continue on her present medications. I briefly discussed foot pain in relation  to back pain and the closed chain effect. She will call if she has further difficulties concerning that. I also discussed the eating problems she was having and did recommend she keep track of her symptoms and see if anything can be associated with them.

## 2016-01-19 NOTE — Telephone Encounter (Signed)
[facility] and [facility] the [facility]

## 2016-01-20 LAB — HEPATITIS C ANTIBODY: HCV AB: NEGATIVE

## 2016-04-28 LAB — HM MAMMOGRAPHY

## 2016-11-29 ENCOUNTER — Telehealth: Payer: Self-pay | Admitting: Family Medicine

## 2016-11-29 DIAGNOSIS — Z0000 Encounter for general adult medical examination without abnormal findings: Secondary | ICD-10-CM

## 2016-11-29 DIAGNOSIS — Z8669 Personal history of other diseases of the nervous system and sense organs: Secondary | ICD-10-CM

## 2016-11-29 MED ORDER — CORGARD 40 MG PO TABS: 40.0000 mg | ORAL_TABLET | Freq: Every day | ORAL | 3 refills | Status: DC

## 2016-11-29 NOTE — Telephone Encounter (Signed)
Patient left message that she was asked to call her insurance and get alternative medication to nadolol 40  And it is  Corgard 40 she said in her message it needs to say DAW 9 in the order and that She needs 90 day supply  [facility] [id number]

## 2016-11-30 ENCOUNTER — Other Ambulatory Visit: Payer: Self-pay

## 2016-11-30 ENCOUNTER — Telehealth: Payer: Self-pay | Admitting: Family Medicine

## 2016-11-30 DIAGNOSIS — Z8669 Personal history of other diseases of the nervous system and sense organs: Secondary | ICD-10-CM

## 2016-11-30 DIAGNOSIS — Z0000 Encounter for general adult medical examination without abnormal findings: Secondary | ICD-10-CM

## 2016-11-30 MED ORDER — NADOLOL 40 MG PO TABS: ORAL_TABLET | ORAL | 3 refills | Status: DC

## 2016-11-30 NOTE — Telephone Encounter (Signed)
Pt called recv'd call from [facility] that Corgard was filled there by mistake, was [facility], please change  Thanks

## 2016-11-30 NOTE — Telephone Encounter (Signed)
[facility]

## 2017-01-11 DIAGNOSIS — H52221 Regular astigmatism, right eye: Secondary | ICD-10-CM | POA: Diagnosis not present

## 2017-01-25 ENCOUNTER — Other Ambulatory Visit: Payer: Self-pay | Admitting: Family Medicine

## 2017-01-25 ENCOUNTER — Telehealth: Payer: Self-pay | Admitting: Family Medicine

## 2017-01-25 DIAGNOSIS — F341 Dysthymic disorder: Secondary | ICD-10-CM

## 2017-01-25 NOTE — Telephone Encounter (Signed)
Pt requesting a name of a podiatrist that she can see for her ongoing issue with bunions. She will not be able to continue seeing her current podiatrist due to and insurance issue. Call pt at [phone]

## 2017-01-25 NOTE — Telephone Encounter (Signed)
I have called pt to inform her she needs to check with her ins. To find out who her ins. Will cover I did tell her we mainly refer to [facility] she verbalized understanding

## 2017-01-26 NOTE — Telephone Encounter (Signed)
She needs a med check appt.I renewed the med

## 2017-01-26 NOTE — Telephone Encounter (Signed)
She has an CPE on 9/18

## 2017-01-26 NOTE — Telephone Encounter (Signed)
Is this okay to refill? 

## 2017-02-21 ENCOUNTER — Encounter: Payer: Self-pay | Admitting: Family Medicine

## 2017-02-21 ENCOUNTER — Ambulatory Visit (INDEPENDENT_AMBULATORY_CARE_PROVIDER_SITE_OTHER): Payer: 59 | Admitting: Family Medicine

## 2017-02-21 VITALS — BP 112/60 | HR 54 | Resp 16 | Ht 60.0 in | Wt 145.0 lb

## 2017-02-21 DIAGNOSIS — Z23 Encounter for immunization: Secondary | ICD-10-CM

## 2017-02-21 DIAGNOSIS — N200 Calculus of kidney: Secondary | ICD-10-CM | POA: Diagnosis not present

## 2017-02-21 DIAGNOSIS — Z8669 Personal history of other diseases of the nervous system and sense organs: Secondary | ICD-10-CM | POA: Diagnosis not present

## 2017-02-21 DIAGNOSIS — Z8673 Personal history of transient ischemic attack (TIA), and cerebral infarction without residual deficits: Secondary | ICD-10-CM

## 2017-02-21 DIAGNOSIS — Z0000 Encounter for general adult medical examination without abnormal findings: Secondary | ICD-10-CM | POA: Diagnosis not present

## 2017-02-21 DIAGNOSIS — M2011 Hallux valgus (acquired), right foot: Secondary | ICD-10-CM | POA: Diagnosis not present

## 2017-02-21 DIAGNOSIS — Z87891 Personal history of nicotine dependence: Secondary | ICD-10-CM

## 2017-02-21 DIAGNOSIS — F341 Dysthymic disorder: Secondary | ICD-10-CM

## 2017-02-21 DIAGNOSIS — M21611 Bunion of right foot: Secondary | ICD-10-CM

## 2017-02-21 LAB — POCT URINALYSIS DIP (PROADVANTAGE DEVICE)
BILIRUBIN UA: NEGATIVE
BILIRUBIN UA: NEGATIVE mg/dL
Blood, UA: NEGATIVE
GLUCOSE UA: NEGATIVE mg/dL
Leukocytes, UA: NEGATIVE
NITRITE UA: NEGATIVE
PH UA: 6.5 (ref 5.0–8.0)
Protein Ur, POC: NEGATIVE mg/dL
Specific Gravity, Urine: 1.015
Urobilinogen, Ur: NEGATIVE

## 2017-02-21 MED ORDER — NADOLOL 40 MG PO TABS: ORAL_TABLET | ORAL | 3 refills | Status: DC

## 2017-02-21 MED ORDER — FLUOXETINE HCL 20 MG PO CAPS: 20.0000 mg | ORAL_CAPSULE | Freq: Every day | ORAL | 3 refills | Status: DC

## 2017-02-21 NOTE — Patient Instructions (Signed)
At the very first sign of a migraine whether it be the aura or not take an Excedrin Migraine

## 2017-02-21 NOTE — Progress Notes (Signed)
Subjective:    Patient ID: Theresa Mack, female    DOB: 01-01-1961, 56 y.o.   MRN: [id number]  HPI She is here for a complete examination. She mainly complains of right foot pain from bunions. She has been seen by Dr. Doran Durand but was unhappy with the care. She would like to get another opinion. It is interfering with her ability to ambulate specifically with walking. She has been on Prozac or treatment of PMS but continues on this after she is now menopausal. She did quit smoking. She does have a history of migraines and apparently had a CVA related migraine greater than 20 years ago. She occasionally will get an aura but in general does not and has usually 1 or 2 per month. She states that Excedrin Migraine works well for this. She also is on Corgard for treatment of her migraine which has cut down on the number of migraines. She does have a history of renal stones but none recently. She also sees her gynecologist and apparently in the past did have an abnormal Pap approximately 2 years ago and will schedule for another evaluation this year. She does have difficulty with exercise due to her feet. Otherwise she has no particular concerns or complaints. Family and social history as well as health maintenance and immunizations were reviewed   Review of Systems  All other systems reviewed and are negative.      Objective:   Physical Exam BP 112/60   Pulse (!) 54   Resp 16   Ht 5' (1.524 m)   Wt 145 lb (65.8 kg)   SpO2 95%   BMI 28.32 kg/m   General Appearance:    Alert, cooperative, no distress, appears stated age  Head:    Normocephalic, without obvious abnormality, atraumatic  Eyes:    PERRL, conjunctiva/corneas clear, EOM's intact, fundi    benign  Ears:    Normal TM's and external ear canals  Nose:   Nares normal, mucosa normal, no drainage or sinus   tenderness  Throat:   Lips, mucosa, and tongue normal; teeth and gums normal  Neck:   Supple, no lymphadenopathy;  thyroid:  no    enlargement/tenderness/nodules; no carotid   bruit or JVD     Lungs:     Clear to auscultation bilaterally without wheezes, rales or     ronchi; respirations unlabored      Heart:    Regular rate and rhythm, S1 and S2 normal, no murmur, rub   or gallop  Breast Exam:    Deferred to GYN  Abdomen:     Soft, non-tender, nondistended, normoactive bowel sounds,    no masses, no hepatosplenomegaly  Genitalia:    Deferred to GYN     Extremities:   No clubbing, cyanosis or edema  Pulses:   2+ and symmetric all extremities  Skin:   Skin color, texture, turgor normal, no rashes or lesions  Lymph nodes:   Cervical, supraclavicular, and axillary nodes normal  Neurologic:   CNII-XII intact, normal strength, sensation and gait; reflexes 2+ and symmetric throughout          Psych:   Normal mood, affect, hygiene and grooming.         Assessment & Plan:  Routine general medical examination at a health care facility - Plan: CBC with Differential/Platelet, Comprehensive metabolic panel, Lipid panel, POCT Urinalysis DIP (Proadvantage Device)  Hallux valgus with bunions of right foot - Plan: Ambulatory referral to Podiatry  History of  migraine headaches - Plan: nadolol (CORGARD) 40 MG tablet  History of CVA (cerebrovascular accident)  Former smoker  Dysthymia - Plan: FLUoxetine (PROZAC) 20 MG capsule  Calcium oxalate renal stones  Need for influenza vaccination - Plan: Flu Vaccine QUAD 6+ mos PF IM (Fluarix Quad PF) Her immunizations were updated. I will refer her to podiatry. Discussed trying to do as much nonsurgical as possible to help with her situation. Discussed treatment of her migraine and strongly encouraged her to use the Excedrin Migraine as soon as she knows she is having a migraine either due to the aura or because of her previous expensive knowing when the headache occurs. Couple met at her on quitting smoking. Discussed possibly stopping the Prozac to see if she indeed does need it  at the present time. He was originally given to her for PMS. No therapy for the stones but she knows when to call if she has trouble.

## 2017-02-22 LAB — COMPREHENSIVE METABOLIC PANEL
AG Ratio: 1.8 (calc) (ref 1.0–2.5)
ALT: 10 U/L (ref 6–29)
AST: 18 U/L (ref 10–35)
Albumin: 4.6 g/dL (ref 3.6–5.1)
Alkaline phosphatase (APISO): 67 U/L (ref 33–130)
BUN: 13 mg/dL (ref 7–25)
CHLORIDE: 106 mmol/L (ref 98–110)
CO2: 26 mmol/L (ref 20–32)
CREATININE: 0.65 mg/dL (ref 0.50–1.05)
Calcium: 9.6 mg/dL (ref 8.6–10.4)
GLOBULIN: 2.6 g/dL (ref 1.9–3.7)
Glucose, Bld: 92 mg/dL (ref 65–99)
Potassium: 4.3 mmol/L (ref 3.5–5.3)
Sodium: 138 mmol/L (ref 135–146)
Total Bilirubin: 1.2 mg/dL (ref 0.2–1.2)
Total Protein: 7.2 g/dL (ref 6.1–8.1)

## 2017-02-22 LAB — CBC WITH DIFFERENTIAL/PLATELET
BASOS ABS: 31 {cells}/uL (ref 0–200)
Basophils Relative: 0.6 %
Eosinophils Absolute: 128 cells/uL (ref 15–500)
Eosinophils Relative: 2.5 %
HEMATOCRIT: 42.4 % (ref 35.0–45.0)
Hemoglobin: 14.6 g/dL (ref 11.7–15.5)
Lymphs Abs: 1454 cells/uL (ref 850–3900)
MCH: 32 pg (ref 27.0–33.0)
MCHC: 34.4 g/dL (ref 32.0–36.0)
MCV: 93 fL (ref 80.0–100.0)
MPV: 10.1 fL (ref 7.5–12.5)
Monocytes Relative: 7.2 %
NEUTROS ABS: 3121 {cells}/uL (ref 1500–7800)
NEUTROS PCT: 61.2 %
PLATELETS: 325 10*3/uL (ref 140–400)
RBC: 4.56 10*6/uL (ref 3.80–5.10)
RDW: 12.3 % (ref 11.0–15.0)
Total Lymphocyte: 28.5 %
WBC: 5.1 10*3/uL (ref 3.8–10.8)
WBCMIX: 367 {cells}/uL (ref 200–950)

## 2017-02-22 LAB — LIPID PANEL
CHOLESTEROL: 236 mg/dL — AB (ref <200)
HDL: 55 mg/dL (ref >50)
LDL CHOLESTEROL (CALC): 160 mg/dL — AB
NON-HDL CHOLESTEROL (CALC): 181 mg/dL — AB (ref <130)
Total CHOL/HDL Ratio: 4.3 (calc) (ref <5.0)
Triglycerides: 100 mg/dL (ref <150)

## 2017-02-28 ENCOUNTER — Encounter: Payer: Self-pay | Admitting: Family Medicine

## 2017-03-07 ENCOUNTER — Encounter: Payer: Self-pay | Admitting: Family Medicine

## 2017-03-07 NOTE — Progress Notes (Signed)
ab 

## 2017-03-09 ENCOUNTER — Telehealth: Payer: Self-pay | Admitting: Family Medicine

## 2017-03-09 NOTE — Telephone Encounter (Signed)
Received requested mammogram and bone density from [facility]. Sending back for review.

## 2017-03-14 ENCOUNTER — Encounter: Payer: Self-pay | Admitting: Family Medicine

## 2017-03-15 ENCOUNTER — Ambulatory Visit (INDEPENDENT_AMBULATORY_CARE_PROVIDER_SITE_OTHER): Payer: 59 | Admitting: Podiatry

## 2017-03-15 ENCOUNTER — Encounter: Payer: Self-pay | Admitting: Podiatry

## 2017-03-15 ENCOUNTER — Ambulatory Visit: Payer: 59

## 2017-03-15 ENCOUNTER — Ambulatory Visit (INDEPENDENT_AMBULATORY_CARE_PROVIDER_SITE_OTHER): Payer: 59

## 2017-03-15 VITALS — BP 117/74 | HR 50 | Resp 16

## 2017-03-15 DIAGNOSIS — M2010 Hallux valgus (acquired), unspecified foot: Secondary | ICD-10-CM | POA: Diagnosis not present

## 2017-03-15 DIAGNOSIS — M21621 Bunionette of right foot: Secondary | ICD-10-CM | POA: Diagnosis not present

## 2017-03-15 DIAGNOSIS — M778 Other enthesopathies, not elsewhere classified: Secondary | ICD-10-CM

## 2017-03-15 DIAGNOSIS — M7751 Other enthesopathy of right foot: Secondary | ICD-10-CM

## 2017-03-15 DIAGNOSIS — M2011 Hallux valgus (acquired), right foot: Secondary | ICD-10-CM | POA: Diagnosis not present

## 2017-03-15 DIAGNOSIS — M2041 Other hammer toe(s) (acquired), right foot: Secondary | ICD-10-CM | POA: Diagnosis not present

## 2017-03-15 DIAGNOSIS — M779 Enthesopathy, unspecified: Secondary | ICD-10-CM

## 2017-03-15 NOTE — Patient Instructions (Signed)
Pre-Operative Instructions  Congratulations, you have decided to take an important step towards improving your quality of life.  You can be assured that the doctors and staff at [facility] will be with you every step of the way.  Here are some important things you should know:  1. Plan to be at the surgery center/hospital at least 1 (one) hour prior to your scheduled time, unless otherwise directed by the surgical center/hospital staff.  You must have a responsible adult accompany you, remain during the surgery and drive you home.  Make sure you have directions to the surgical center/hospital to ensure you arrive on time. 2. If you are having surgery at [facility] or [facility], you will need a copy of your medical history and physical form from your family physician within one month prior to the date of surgery. We will give you a form for your primary physician to complete.  3. We make every effort to accommodate the date you request for surgery.  However, there are times where surgery dates or times have to be moved.  We will contact you as soon as possible if a change in schedule is required.   4. No aspirin/ibuprofen for one week before surgery.  If you are on aspirin, any non-steroidal anti-inflammatory medications (Mobic, Aleve, Ibuprofen) should not be taken seven (7) days prior to your surgery.  You make take Tylenol for pain prior to surgery.  5. Medications - If you are taking daily heart and blood pressure medications, seizure, reflux, allergy, asthma, anxiety, pain or diabetes medications, make sure you notify the surgery center/hospital before the day of surgery so they can tell you which medications you should take or avoid the day of surgery. 6. No food or drink after midnight the night before surgery unless directed otherwise by surgical center/hospital staff. 7. No alcoholic beverages 24-hours prior to surgery.  No smoking 24-hours prior or 24-hours after  surgery. 8. Wear loose pants or shorts. They should be loose enough to fit over bandages, boots, and casts. 9. Don't wear slip-on shoes. Sneakers are preferred. 10. Bring your boot with you to the surgery center/hospital.  Also bring crutches or a walker if your physician has prescribed it for you.  If you do not have this equipment, it will be provided for you after surgery. 11. If you have not been contacted by the surgery center/hospital by the day before your surgery, call to confirm the date and time of your surgery. 12. Leave-time from work may vary depending on the type of surgery you have.  Appropriate arrangements should be made prior to surgery with your employer. 13. Prescriptions will be provided immediately following surgery by your doctor.  Fill these as soon as possible after surgery and take the medication as directed. Pain medications will not be refilled on weekends and must be approved by the doctor. 14. Remove nail polish on the operative foot and avoid getting pedicures prior to surgery. 15. Wash the night before surgery.  The night before surgery wash the foot and leg well with water and the antibacterial soap provided. Be sure to pay special attention to beneath the toenails and in between the toes.  Wash for at least three (3) minutes. Rinse thoroughly with water and dry well with a towel.  Perform this wash unless told not to do so by your physician.  Enclosed: 1 Ice pack (please put in freezer the night before surgery)   1 Hibiclens skin cleaner     Pre-op instructions  If you have any questions regarding the instructions, please do not hesitate to call our office.  [place]: [address], [place], [place] [address] -- [phone]  [place]: [address] [place], [place] [address] -- [phone]  [place]: [address]  [place], [place] [address] -- [phone]  [place]: [address], [address], [place], [place] [address] -- [phone]  Website:  [email or web address] 

## 2017-03-15 NOTE — Progress Notes (Signed)
Subjective:  Patient ID: Theresa Mack, female    DOB: [birth year],  MRN: [id number] HPI Chief Complaint  Patient presents with  . Foot Pain    1st MPJ bilateral and 5th MPJ right - deformities for years but starting to get more bothersome, wears silicone padding with shoes, tried different shoes, some redness and swelling    56 y.o. female presents with the above complaint.     Past Medical History:  Diagnosis Date  . Stroke ([place])   . Tubular adenoma of colon 01/16/2010   colonoscopy Dr. Collene Mares   No past surgical history on file.  Current Outpatient Prescriptions:  .  aspirin 81 MG tablet, Take 81 mg by mouth daily.  , Disp: , Rfl:  .  Calcium Carbonate-Vitamin D (CALCIUM 500 + D PO), Take 500 mg by mouth.  , Disp: , Rfl:  .  Cholecalciferol (VITAMIN D3) 1000 UNITS CAPS, Take 2,000 Units by mouth.  , Disp: , Rfl:  .  fish oil-omega-3 fatty acids 1000 MG capsule, Take 1 g by mouth daily.  , Disp: , Rfl:  .  FLUoxetine (PROZAC) 20 MG capsule, Take 1 capsule (20 mg total) by mouth daily., Disp: 90 capsule, Rfl: 3 .  Multiple Vitamins-Minerals (MULTIVITAMIN WITH MINERALS) tablet, Take 1 tablet by mouth daily.  , Disp: , Rfl:  .  nadolol (CORGARD) 40 MG tablet, Take 1 tablet by mouth  daily, Disp: 90 tablet, Rfl: 3  Allergies  Allergen Reactions  . Codeine Nausea Only   Review of Systems  All other systems reviewed and are negative.  Objective:  There were no vitals filed for this visit.  General: Well developed, nourished, in no acute distress, alert and oriented x3   Dermatological: Skin is warm, dry and supple bilateral. Nails x 10 are well maintained; remaining integument appears unremarkable at this time. There are no open sores, no preulcerative lesions, no rash or signs of infection present.  Vascular: Dorsalis Pedis artery and Posterior Tibial artery pedal pulses are 2/4 bilateral with immedate capillary fill time. Pedal hair growth present. No varicosities and no  lower extremity edema present bilateral.   Neruologic: Grossly intact via light touch bilateral. Vibratory intact via tuning fork bilateral. Protective threshold with Semmes Wienstein monofilament intact to all pedal sites bilateral. Patellar and Achilles deep tendon reflexes 2+ bilateral. No Babinski or clonus noted bilateral.   Musculoskeletal: No gross boney pedal deformities bilateral. No pain, crepitus, or limitation noted with foot and ankle range of motion bilateral. Muscular strength 5/5 in all groups tested bilateral. She has pain on palpation of the first metatarsophalangeal joint bilaterally right greater than left. Hallux abductovalgus deformity is very obvious she does have pain on palpation and range of motion of these joints. Severe pain on palpation of the tailor's bunion deformity right foot over the left. The first metatarsal medial cuneiforms joint is hypermobile flexible. She has pain on end range of motion of the fifth toe at the metatarsophalangeal joint.  Gait: Unassisted, Nonantalgic.    Radiographs:  Radiographs taken in the office today demonstrated osseously mature individuals bilateral foot 3 views. Severe hallux abductovalgus deformities right greater than left tibial sesamoid is in a position of 4 or 5 fibular sesamoid is sitting in the interspace. Tailor's bunion deformity is present right over left with adduction of her toe. No fractures are identified. Osteoarthritic changes are noted at the level of the metatarsophalangeal joint.  Assessment & Plan:   Assessment: Flexible hallux abductovalgus deformity right  foot tailor's bunion deformity right foot with capsulitis fifth metatarsophalangeal joint right foot  Hallux valgus deformity left foot tailor's bunion deformity left foot.    Plan: We discussed the etiology pathology conservative versus surgical therapies. At this point we discussed a Lapidus procedure bunionectomy with fifth metatarsal osteotomy and a  derotational arthroplasty fifth toe all with the cast on the right foot. She understood this was amenable to a +03 patient the consent form she understands she'll be out of work for a period of time but is looking forward to having her foot fixed. We did discuss the possible postop complications which may include but are not limited to postop pain bleeding swelling infection recurrence need for further surgery over correction under correction loss of digit loss of limb and loss of life. We did discuss the surgery Center today where this will be performed on an outpatient basis and we also discussed anesthesia with a block.  I injected the fifth metatarsophalangeal joint today with dexamethasone and local anesthetic to alleviate her symptoms currently. She will be going on a cruise in December and may need another injection by that time.     Suzzette Gasparro T. [place], [place]

## 2017-03-29 ENCOUNTER — Telehealth: Payer: Self-pay | Admitting: *Deleted

## 2017-03-29 NOTE — Telephone Encounter (Signed)
I was calling to see about making an appointment to have some surgery.  I was given your contact information and just wanted to see about scheduling that in January."

## 2017-03-29 NOTE — Telephone Encounter (Signed)
"  I was calling about scheduling an appointment for surgery with Dr. Milinda Pointer."  I attempted to return her call.  I left her a message to call me tomorrow.

## 2017-03-30 NOTE — Telephone Encounter (Signed)
Might have vicki run a report on people who have a lapidus in the past year or so. Look for my patients and get me the list.  I will pick.  We should also give the patient she talks to a gift card if she continues on with the surgery.

## 2017-03-30 NOTE — Telephone Encounter (Signed)
I attempted to return her call.  I left messages for her to call me back.

## 2017-03-30 NOTE — Telephone Encounter (Signed)
"  I'm sorry I missed your call.  I been in meetings.  I was calling to schedule my surgery with Dr. Milinda Pointer."  Dr. Milinda Pointer does surgery on Fridays.  Do you have a date in mind?  "Yes, I'd like to do it next year on January 4.  Does he have time available on that date?"  Yes, he does have time available.  I will get you scheduled.  "Will you take care of getting it pre-certified?"  Yes, I will get it pre-certified.  "Will you call me with an estimate?"  Jocelyn Lamer in [employer] department will give you a call with that information.  You will need to contact the surgical center about their cost as well as anesthesia costs.  They will ask for the time needed for surgery.  Your surgery will be about 2.5 hours long.  "Is there any way possible I can speak to someone who may have had this procedure?  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  She would have to run a report and get permission from patient's to see if it is okay for you to speak to them.  It's a long process.  "That would be great, I really would like to speak to someone about their experience.  Please have them give me a call."  (Lapidus Procedure Including Bunionectomy, Metatarsal Osteotomy 5th, Hammer Toe Repair 5th, cast rt)

## 2017-04-18 ENCOUNTER — Telehealth: Payer: Self-pay | Admitting: *Deleted

## 2017-04-18 NOTE — Telephone Encounter (Signed)
I left the patient a message that I got her message.  I will cancel the appointment.  I asked her to give me a call if she wanted to reschedule the appointment.  I called and left a message for [name] at [facility] to cancel surgery for January 4.

## 2017-04-18 NOTE — Telephone Encounter (Signed)
"  I have a surgery appointment in January with Dr. Milinda Pointer.  I need to cancel that surgery at this point.  It was scheduled for January 4.  Please call and confirm that you received the message."

## 2017-04-22 ENCOUNTER — Encounter: Payer: Self-pay | Admitting: Podiatry

## 2017-04-28 DIAGNOSIS — Z01419 Encounter for gynecological examination (general) (routine) without abnormal findings: Secondary | ICD-10-CM | POA: Diagnosis not present

## 2017-05-26 ENCOUNTER — Ambulatory Visit: Payer: 59 | Admitting: Family Medicine

## 2017-05-26 ENCOUNTER — Encounter: Payer: Self-pay | Admitting: Family Medicine

## 2017-05-26 VITALS — BP 110/74 | HR 77 | Temp 99.9°F | Wt 148.4 lb

## 2017-05-26 DIAGNOSIS — J209 Acute bronchitis, unspecified: Secondary | ICD-10-CM

## 2017-05-26 MED ORDER — AZITHROMYCIN 500 MG PO TABS: 500.0000 mg | ORAL_TABLET | Freq: Every day | ORAL | 0 refills | Status: DC

## 2017-05-26 NOTE — Progress Notes (Signed)
   Subjective:    Patient ID: Theresa Mack, female    DOB: 12/14/1960, 56 y.o.   MRN: [id number]  HPI Approximately 6 days ago she developed rhinorrhea, dry cough that has become a productive cough with slight headache, ear congestion and some chills.  The headache has diminished.  Today she woke up and feels much worse.  She does not smoke.   Review of Systems     Objective:   Physical Exam Alert and in no distress. Tympanic membranes and canals are normal. Pharyngeal area is normal. Neck is supple without adenopathy or thyromegaly. Cardiac exam shows a regular sinus rhythm without murmurs or gallops. Lungs show rhonchi on forced expiration.        Assessment & Plan:  Acute bronchitis, unspecified organism - Plan: azithromycin (ZITHROMAX) 500 MG tablet  Recommend supportive care.  She will call in 1 week if not totally back to normal.

## 2017-05-27 ENCOUNTER — Ambulatory Visit: Payer: 59 | Admitting: Medical

## 2017-06-23 ENCOUNTER — Other Ambulatory Visit: Payer: 59

## 2017-11-22 ENCOUNTER — Encounter: Payer: Self-pay | Admitting: Family Medicine

## 2017-12-07 ENCOUNTER — Other Ambulatory Visit: Payer: Self-pay

## 2017-12-07 ENCOUNTER — Telehealth: Payer: Self-pay | Admitting: Family Medicine

## 2017-12-07 DIAGNOSIS — Z8669 Personal history of other diseases of the nervous system and sense organs: Secondary | ICD-10-CM

## 2017-12-07 MED ORDER — NADOLOL 40 MG PO TABS: ORAL_TABLET | ORAL | 3 refills | Status: DC

## 2017-12-07 NOTE — Telephone Encounter (Signed)
Done KH 

## 2017-12-07 NOTE — Telephone Encounter (Signed)
[facility] req Nadolol tabs 40 mg refill

## 2017-12-08 ENCOUNTER — Other Ambulatory Visit: Payer: Self-pay

## 2017-12-08 ENCOUNTER — Telehealth: Payer: Self-pay | Admitting: Family Medicine

## 2017-12-08 DIAGNOSIS — Z8669 Personal history of other diseases of the nervous system and sense organs: Secondary | ICD-10-CM

## 2017-12-08 MED ORDER — NADOLOL 40 MG PO TABS: ORAL_TABLET | ORAL | 3 refills | Status: DC

## 2017-12-08 NOTE — Telephone Encounter (Signed)
Done and pt is aware [facility]

## 2017-12-08 NOTE — Telephone Encounter (Signed)
Pt left message that refill [facility]ol [facility] she is not covered with [facility].  She would like it cancelled and [facility].  Called pt and informed would be switched

## 2017-12-30 DIAGNOSIS — L821 Other seborrheic keratosis: Secondary | ICD-10-CM | POA: Diagnosis not present

## 2017-12-30 DIAGNOSIS — D225 Melanocytic nevi of trunk: Secondary | ICD-10-CM | POA: Diagnosis not present

## 2017-12-30 DIAGNOSIS — L814 Other melanin hyperpigmentation: Secondary | ICD-10-CM | POA: Diagnosis not present

## 2018-01-19 DIAGNOSIS — H5202 Hypermetropia, left eye: Secondary | ICD-10-CM | POA: Diagnosis not present

## 2018-02-23 ENCOUNTER — Encounter: Payer: Self-pay | Admitting: Family Medicine

## 2018-02-23 ENCOUNTER — Ambulatory Visit (INDEPENDENT_AMBULATORY_CARE_PROVIDER_SITE_OTHER): Payer: 59 | Admitting: Family Medicine

## 2018-02-23 VITALS — BP 116/78 | HR 66 | Temp 98.0°F | Ht 60.0 in | Wt 144.8 lb

## 2018-02-23 DIAGNOSIS — Z8673 Personal history of transient ischemic attack (TIA), and cerebral infarction without residual deficits: Secondary | ICD-10-CM

## 2018-02-23 DIAGNOSIS — Z23 Encounter for immunization: Secondary | ICD-10-CM

## 2018-02-23 DIAGNOSIS — Z87891 Personal history of nicotine dependence: Secondary | ICD-10-CM | POA: Diagnosis not present

## 2018-02-23 DIAGNOSIS — F341 Dysthymic disorder: Secondary | ICD-10-CM

## 2018-02-23 DIAGNOSIS — Z0000 Encounter for general adult medical examination without abnormal findings: Secondary | ICD-10-CM | POA: Diagnosis not present

## 2018-02-23 DIAGNOSIS — Z8669 Personal history of other diseases of the nervous system and sense organs: Secondary | ICD-10-CM | POA: Diagnosis not present

## 2018-02-23 DIAGNOSIS — N200 Calculus of kidney: Secondary | ICD-10-CM

## 2018-02-23 MED ORDER — NADOLOL 40 MG PO TABS: ORAL_TABLET | ORAL | 3 refills | Status: DC

## 2018-02-23 NOTE — Patient Instructions (Signed)
20 minutes of something physical daily or 150 minutes a week of something physical. Cut back on carbohydrates and the easiest way to remember that is white food.

## 2018-02-23 NOTE — Progress Notes (Signed)
Subjective:    Patient ID: Theresa Mack, female    DOB: 1960/10/12, 57 y.o.   MRN: [id number]  HPI She is here for complete examination.  She does have a history of migraine headaches and did have a CVA associated with that migraine several years ago.  Presently she is on Cologuard and has migraine twice per month.  She uses Excedrin for that with good results.  She has a history of dysthymia and stopped taking her Prozac 2 weeks ago.  This was discussed with her in the past and she is going to see how well she does off of it.  Presently she is having a little bit more of mood swings.  She does have a history of kidney stones but no troubles recently.  She is a former smoker.  She does see her gynecologist and gets regular mammograms and Pap smears.  Family and social history as well as health maintenance and immunizations was reviewed.   Review of Systems  All other systems reviewed and are negative.      Objective:   Physical Exam BP 116/78 (BP Location: Left Arm, Patient Position: Sitting)   Pulse 66   Temp 98 F (36.7 C)   Ht 5' (1.524 m)   Wt 144 lb 12.8 oz (65.7 kg)   SpO2 98%   BMI 28.28 kg/m   General Appearance:    Alert, cooperative, no distress, appears stated age  Head:    Normocephalic, without obvious abnormality, atraumatic  Eyes:    PERRL, conjunctiva/corneas clear, EOM's intact, fundi    benign  Ears:    Normal TM's and external ear canals  Nose:   Nares normal, mucosa normal, no drainage or sinus   tenderness  Throat:   Lips, mucosa, and tongue normal; teeth and gums normal  Neck:   Supple, no lymphadenopathy;  thyroid:  no   enlargement/tenderness/nodules; no carotid   bruit or JVD  Back:    Spine nontender, no curvature, ROM normal, no CVA     tenderness  Lungs:     Clear to auscultation bilaterally without wheezes, rales or     ronchi; respirations unlabored      Heart:    Regular rate and rhythm, S1 and S2 normal, no murmur, rub   or gallop  Breast  Exam:    Deferred to GYN  Abdomen:     Soft, non-tender, nondistended, normoactive bowel sounds,    no masses, no hepatosplenomegaly  Genitalia:    Deferred to GYN     Extremities:   No clubbing, cyanosis or edema  Pulses:   2+ and symmetric all extremities  Skin:   Skin color, texture, turgor normal, no rashes or lesions  Lymph nodes:   Cervical, supraclavicular, and axillary nodes normal  Neurologic:   CNII-XII intact, normal strength, sensation and gait; reflexes 2+ and symmetric throughout          Psych:   Normal mood, affect, hygiene and grooming.          Assessment & Plan:   Routine general medical examination at a health care facility - Plan: CBC with Differential/Platelet, Comprehensive metabolic panel, Lipid panel  History of migraine headaches  History of CVA (cerebrovascular accident)  Former smoker  Calcium oxalate renal stones  Dysthymia  Need for influenza vaccination - Plan: Flu Vaccine QUAD 6+ mos PF IM (Fluarix Quad PF) I reviewed the most recent criteria for Pap smears and mammograms with her.  Recommended to Pap  every 3 years with possible discontinuation at age 14.  Also mammogram every other year. She will keep me informed as to how she is doing tapering off of her Prozac. She will continue on her present migraine prophylaxis and treatment plan.

## 2018-02-24 LAB — CBC WITH DIFFERENTIAL/PLATELET
Basophils Absolute: 0 10*3/uL (ref 0.0–0.2)
Basos: 1 %
EOS (ABSOLUTE): 0.2 10*3/uL (ref 0.0–0.4)
EOS: 4 %
HEMOGLOBIN: 15.2 g/dL (ref 11.1–15.9)
Hematocrit: 43.5 % (ref 34.0–46.6)
Immature Grans (Abs): 0 10*3/uL (ref 0.0–0.1)
Immature Granulocytes: 0 %
Lymphocytes Absolute: 1.6 10*3/uL (ref 0.7–3.1)
Lymphs: 31 %
MCH: 33.3 pg — ABNORMAL HIGH (ref 26.6–33.0)
MCHC: 34.9 g/dL (ref 31.5–35.7)
MCV: 95 fL (ref 79–97)
MONOCYTES: 7 %
Monocytes Absolute: 0.4 10*3/uL (ref 0.1–0.9)
NEUTROS PCT: 57 %
Neutrophils Absolute: 3 10*3/uL (ref 1.4–7.0)
Platelets: 292 10*3/uL (ref 150–450)
RBC: 4.57 x10E6/uL (ref 3.77–5.28)
RDW: 12.5 % (ref 12.3–15.4)
WBC: 5.2 10*3/uL (ref 3.4–10.8)

## 2018-02-24 LAB — LIPID PANEL
CHOL/HDL RATIO: 4.3 ratio (ref 0.0–4.4)
CHOLESTEROL TOTAL: 204 mg/dL — AB (ref 100–199)
HDL: 48 mg/dL (ref >39)
LDL Calculated: 135 mg/dL — ABNORMAL HIGH (ref 0–99)
Triglycerides: 104 mg/dL (ref 0–149)
VLDL Cholesterol Cal: 21 mg/dL (ref 5–40)

## 2018-02-24 LAB — COMPREHENSIVE METABOLIC PANEL
ALK PHOS: 69 IU/L (ref 39–117)
ALT: 12 IU/L (ref 0–32)
AST: 20 IU/L (ref 0–40)
Albumin/Globulin Ratio: 1.9 (ref 1.2–2.2)
Albumin: 4.3 g/dL (ref 3.5–5.5)
BUN/Creatinine Ratio: 15 (ref 9–23)
BUN: 11 mg/dL (ref 6–24)
Bilirubin Total: 0.6 mg/dL (ref 0.0–1.2)
CO2: 22 mmol/L (ref 20–29)
CREATININE: 0.75 mg/dL (ref 0.57–1.00)
Calcium: 9.5 mg/dL (ref 8.7–10.2)
Chloride: 108 mmol/L — ABNORMAL HIGH (ref 96–106)
GFR calc Af Amer: 101 mL/min/{1.73_m2} (ref >59)
GFR calc non Af Amer: 88 mL/min/{1.73_m2} (ref >59)
Globulin, Total: 2.3 g/dL (ref 1.5–4.5)
Glucose: 83 mg/dL (ref 65–99)
POTASSIUM: 4.8 mmol/L (ref 3.5–5.2)
Sodium: 147 mmol/L — ABNORMAL HIGH (ref 134–144)
Total Protein: 6.6 g/dL (ref 6.0–8.5)

## 2018-05-08 DIAGNOSIS — Z01419 Encounter for gynecological examination (general) (routine) without abnormal findings: Secondary | ICD-10-CM | POA: Diagnosis not present

## 2018-05-08 DIAGNOSIS — Z6828 Body mass index (BMI) 28.0-28.9, adult: Secondary | ICD-10-CM | POA: Diagnosis not present

## 2018-05-09 LAB — HM MAMMOGRAPHY

## 2018-05-12 LAB — HM PAP SMEAR: HM Pap smear: NEGATIVE

## 2018-08-28 ENCOUNTER — Other Ambulatory Visit: Payer: Self-pay | Admitting: Family Medicine

## 2018-08-28 ENCOUNTER — Other Ambulatory Visit: Payer: Self-pay

## 2018-08-28 ENCOUNTER — Encounter: Payer: Self-pay | Admitting: Family Medicine

## 2018-08-28 DIAGNOSIS — F341 Dysthymic disorder: Secondary | ICD-10-CM

## 2018-08-28 MED ORDER — FLUOXETINE HCL 20 MG PO CAPS: 20.0000 mg | ORAL_CAPSULE | Freq: Every day | ORAL | 0 refills | Status: DC

## 2018-08-30 ENCOUNTER — Telehealth: Payer: Self-pay

## 2018-08-30 ENCOUNTER — Telehealth: Payer: Self-pay | Admitting: Family Medicine

## 2018-08-30 NOTE — Telephone Encounter (Signed)
Pt called back about this medication (prozac) and I explained to her that the medication had [facility]

## 2018-08-30 NOTE — Telephone Encounter (Signed)
Called [facility] to advise that [facility]. Tried to call yesterday to cancel the script unfortunately  Their hold time is greater than 30 min. Pt may not be able to pick up the prozac script at local pharmacy due to [facility] already sending out [facility] script. LVM for pt to call. [place]

## 2019-01-04 ENCOUNTER — Telehealth: Payer: Self-pay | Admitting: Family Medicine

## 2019-01-04 DIAGNOSIS — F341 Dysthymic disorder: Secondary | ICD-10-CM

## 2019-01-04 MED ORDER — FLUOXETINE HCL 20 MG PO CAPS: 20.0000 mg | ORAL_CAPSULE | Freq: Every day | ORAL | 0 refills | Status: DC

## 2019-01-04 NOTE — Telephone Encounter (Signed)
Pt called and needs refill of Fluoxetine [facility] to [facility] on [address]

## 2019-02-27 ENCOUNTER — Other Ambulatory Visit: Payer: Self-pay

## 2019-02-27 ENCOUNTER — Ambulatory Visit (INDEPENDENT_AMBULATORY_CARE_PROVIDER_SITE_OTHER): Payer: Managed Care, Other (non HMO) | Admitting: Family Medicine

## 2019-02-27 ENCOUNTER — Encounter: Payer: Self-pay | Admitting: Family Medicine

## 2019-02-27 VITALS — BP 112/76 | HR 68 | Temp 98.9°F | Ht 60.0 in | Wt 129.2 lb

## 2019-02-27 DIAGNOSIS — F341 Dysthymic disorder: Secondary | ICD-10-CM

## 2019-02-27 DIAGNOSIS — Z0000 Encounter for general adult medical examination without abnormal findings: Secondary | ICD-10-CM

## 2019-02-27 DIAGNOSIS — Z8673 Personal history of transient ischemic attack (TIA), and cerebral infarction without residual deficits: Secondary | ICD-10-CM

## 2019-02-27 DIAGNOSIS — Z8669 Personal history of other diseases of the nervous system and sense organs: Secondary | ICD-10-CM

## 2019-02-27 DIAGNOSIS — Z87891 Personal history of nicotine dependence: Secondary | ICD-10-CM

## 2019-02-27 DIAGNOSIS — Z8742 Personal history of other diseases of the female genital tract: Secondary | ICD-10-CM | POA: Insufficient documentation

## 2019-02-27 DIAGNOSIS — Z23 Encounter for immunization: Secondary | ICD-10-CM

## 2019-02-27 DIAGNOSIS — N200 Calculus of kidney: Secondary | ICD-10-CM

## 2019-02-27 MED ORDER — FLUOXETINE HCL 20 MG PO CAPS: 20.0000 mg | ORAL_CAPSULE | Freq: Every day | ORAL | 0 refills | Status: DC

## 2019-02-27 MED ORDER — NADOLOL 40 MG PO TABS: ORAL_TABLET | ORAL | 3 refills | Status: DC

## 2019-02-27 NOTE — Progress Notes (Signed)
   Subjective:    Patient ID: Theresa Mack, female    DOB: 05/21/1961, 58 y.o.   MRN: [id number]  HPI She is here for complete examination.  She does have a history of migraine headaches but is now having usually just 1/month.  She has remote history of CVA second.  No migraine.  She also sees her gynecologist regularly due to abnormal Paps and also gets a mammogram.  She also has a history of renal stones but none recently.  She is a former smoker.  She started taking Prozac again especially with the [place] and her job situation.  She seems to be doing well on that.   Review of Systems  All other systems reviewed and are negative.      Objective:   Physical Exam Alert and in no distress. Tympanic membranes and canals are normal. Pharyngeal area is normal. Neck is supple without adenopathy or thyromegaly. Cardiac exam shows a regular sinus rhythm without murmurs or gallops. Lungs are clear to auscultation.       Assessment & Plan:  Routine general medical examination at a health care facility - Plan: CBC with Differential/Platelet, Comprehensive metabolic panel, Lipid panel  Need for influenza vaccination - Plan: Flu Vaccine QUAD 36+ mos IM  Dysthymia  History of CVA (cerebrovascular accident)  Former smoker  History of migraine headaches  Calcium oxalate renal stones  Need for shingles vaccine - Plan: Varicella-zoster vaccine IM Recommend she continue on her present medication regimen.  She is now semiretired and seems to be enjoying this and plans to visit her grandchildren.  Encouraged her to become more physically active. She will return at a later date for shingles shot

## 2019-02-28 ENCOUNTER — Telehealth: Payer: Self-pay | Admitting: Family Medicine

## 2019-02-28 ENCOUNTER — Encounter: Payer: Self-pay | Admitting: Family Medicine

## 2019-02-28 DIAGNOSIS — F341 Dysthymic disorder: Secondary | ICD-10-CM

## 2019-02-28 LAB — LIPID PANEL
Chol/HDL Ratio: 4.1 ratio (ref 0.0–4.4)
Cholesterol, Total: 232 mg/dL — ABNORMAL HIGH (ref 100–199)
HDL: 56 mg/dL (ref >39)
LDL Chol Calc (NIH): 160 mg/dL — ABNORMAL HIGH (ref 0–99)
Triglycerides: 91 mg/dL (ref 0–149)
VLDL Cholesterol Cal: 16 mg/dL (ref 5–40)

## 2019-02-28 LAB — CBC WITH DIFFERENTIAL/PLATELET
Basophils Absolute: 0 10*3/uL (ref 0.0–0.2)
Basos: 0 %
EOS (ABSOLUTE): 0.2 10*3/uL (ref 0.0–0.4)
Eos: 3 %
Hematocrit: 44.6 % (ref 34.0–46.6)
Hemoglobin: 15.3 g/dL (ref 11.1–15.9)
Immature Grans (Abs): 0 10*3/uL (ref 0.0–0.1)
Immature Granulocytes: 0 %
Lymphocytes Absolute: 1.9 10*3/uL (ref 0.7–3.1)
Lymphs: 28 %
MCH: 33 pg (ref 26.6–33.0)
MCHC: 34.3 g/dL (ref 31.5–35.7)
MCV: 96 fL (ref 79–97)
Monocytes Absolute: 0.5 10*3/uL (ref 0.1–0.9)
Monocytes: 7 %
Neutrophils Absolute: 4.3 10*3/uL (ref 1.4–7.0)
Neutrophils: 62 %
Platelets: 296 10*3/uL (ref 150–450)
RBC: 4.63 x10E6/uL (ref 3.77–5.28)
RDW: 12.6 % (ref 11.7–15.4)
WBC: 6.9 10*3/uL (ref 3.4–10.8)

## 2019-02-28 LAB — COMPREHENSIVE METABOLIC PANEL
ALT: 15 IU/L (ref 0–32)
AST: 19 IU/L (ref 0–40)
Albumin/Globulin Ratio: 3.1 — ABNORMAL HIGH (ref 1.2–2.2)
Albumin: 5 g/dL — ABNORMAL HIGH (ref 3.8–4.9)
Alkaline Phosphatase: 76 IU/L (ref 39–117)
BUN/Creatinine Ratio: 16 (ref 12–28)
BUN: 11 mg/dL (ref 8–27)
Bilirubin Total: 1.4 mg/dL — ABNORMAL HIGH (ref 0.0–1.2)
CO2: 22 mmol/L (ref 20–29)
Calcium: 9.7 mg/dL (ref 8.7–10.3)
Chloride: 105 mmol/L (ref 96–106)
Creatinine, Ser: 0.68 mg/dL (ref 0.57–1.00)
GFR calc Af Amer: 110 mL/min/{1.73_m2} (ref >59)
GFR calc non Af Amer: 95 mL/min/{1.73_m2} (ref >59)
Globulin, Total: 1.6 g/dL (ref 1.5–4.5)
Glucose: 86 mg/dL (ref 65–99)
Potassium: 4.4 mmol/L (ref 3.5–5.2)
Sodium: 140 mmol/L (ref 134–144)
Total Protein: 6.6 g/dL (ref 6.0–8.5)

## 2019-02-28 MED ORDER — FLUOXETINE HCL 20 MG PO CAPS: 20.0000 mg | ORAL_CAPSULE | Freq: Every day | ORAL | 0 refills | Status: DC

## 2019-02-28 NOTE — Progress Notes (Signed)
Can sign off on this until you take care of the shots

## 2019-02-28 NOTE — Telephone Encounter (Signed)
Pt called and stated she asked that the Fluoxetine go to the [facility] [address] and [facility]  [facility] said [facility] cost about [facility] of [employer].  Please send to [facility]

## 2019-03-21 ENCOUNTER — Other Ambulatory Visit (INDEPENDENT_AMBULATORY_CARE_PROVIDER_SITE_OTHER): Payer: Managed Care, Other (non HMO)

## 2019-03-21 ENCOUNTER — Other Ambulatory Visit: Payer: Self-pay

## 2019-03-21 DIAGNOSIS — Z23 Encounter for immunization: Secondary | ICD-10-CM | POA: Diagnosis not present

## 2019-05-21 ENCOUNTER — Other Ambulatory Visit: Payer: Self-pay

## 2019-05-21 ENCOUNTER — Other Ambulatory Visit (INDEPENDENT_AMBULATORY_CARE_PROVIDER_SITE_OTHER): Payer: PRIVATE HEALTH INSURANCE

## 2019-05-21 DIAGNOSIS — Z23 Encounter for immunization: Secondary | ICD-10-CM | POA: Diagnosis not present

## 2019-06-27 LAB — HM DEXA SCAN

## 2019-06-27 LAB — HM PAP SMEAR

## 2019-06-27 LAB — RESULTS CONSOLE HPV: CHL HPV: NEGATIVE

## 2019-06-28 LAB — HM MAMMOGRAPHY

## 2019-06-29 LAB — HM PAP SMEAR: HM Pap smear: NEGATIVE

## 2019-07-19 ENCOUNTER — Encounter: Payer: Self-pay | Admitting: *Deleted

## 2019-07-24 ENCOUNTER — Encounter: Payer: Self-pay | Admitting: Family Medicine

## 2019-10-31 ENCOUNTER — Encounter: Payer: Self-pay | Admitting: Family Medicine

## 2019-10-31 ENCOUNTER — Other Ambulatory Visit: Payer: Self-pay | Admitting: Family Medicine

## 2019-10-31 DIAGNOSIS — F341 Dysthymic disorder: Secondary | ICD-10-CM

## 2019-10-31 DIAGNOSIS — Z800 Family history of malignant neoplasm of digestive organs: Secondary | ICD-10-CM

## 2019-10-31 MED ORDER — FLUOXETINE HCL 20 MG PO CAPS: 20.0000 mg | ORAL_CAPSULE | Freq: Every day | ORAL | 0 refills | Status: DC

## 2019-11-01 ENCOUNTER — Encounter: Payer: Self-pay | Admitting: Gastroenterology

## 2020-01-13 HISTORY — PX: COLONOSCOPY: SHX174

## 2020-01-18 ENCOUNTER — Ambulatory Visit (AMBULATORY_SURGERY_CENTER): Payer: Self-pay

## 2020-01-18 ENCOUNTER — Other Ambulatory Visit: Payer: Self-pay

## 2020-01-18 ENCOUNTER — Encounter: Payer: Self-pay | Admitting: Gastroenterology

## 2020-01-18 VITALS — Ht 60.0 in | Wt 139.0 lb

## 2020-01-18 DIAGNOSIS — Z1211 Encounter for screening for malignant neoplasm of colon: Secondary | ICD-10-CM

## 2020-01-18 MED ORDER — SUTAB 1479-225-188 MG PO TABS: 1.0000 | ORAL_TABLET | ORAL | 0 refills | Status: DC

## 2020-01-18 NOTE — Progress Notes (Signed)
No egg or soy allergy known to patient  No issues with past sedation with any surgeries or procedures No intubation problems in the past  No FH of Malignant Hyperthermia No diet pills per patient No home 02 use per patient  No blood thinners per patient  Pt denies issues with constipation  No A fib or A flutter  EMMI video via [place] 19 guidelines implemented in PV today with Pt and RN  Coupon given to pt in PV today , Code to Pharmacy  COVID vaccines completed in 09/2019 per pt; Due to the COVID-19 pandemic we are asking patients to follow these guidelines. Please only bring one care partner. Please be aware that your care partner may wait in the car in the parking lot or if they feel like they will be too hot to wait in the car, they may wait in the lobby on the 4th floor. All care partners are required to wear a mask the entire time (we do not have any that we can provide them), they need to practice social distancing, and we will do a Covid check for all patient's and care partners when you arrive. Also we will check their temperature and your temperature. If the care partner waits in their car they need to stay in the parking lot the entire time and we will call them on their cell phone when the patient is ready for discharge so they can bring the car to the front of the building. Also all patient's will need to wear a mask into building.

## 2020-01-29 ENCOUNTER — Other Ambulatory Visit: Payer: Self-pay | Admitting: Family Medicine

## 2020-01-29 DIAGNOSIS — F341 Dysthymic disorder: Secondary | ICD-10-CM

## 2020-01-29 NOTE — Telephone Encounter (Signed)
[facility]. Please advise . [facility] 

## 2020-02-01 ENCOUNTER — Ambulatory Visit (AMBULATORY_SURGERY_CENTER): Payer: PRIVATE HEALTH INSURANCE | Admitting: Gastroenterology

## 2020-02-01 ENCOUNTER — Encounter: Payer: Self-pay | Admitting: Gastroenterology

## 2020-02-01 ENCOUNTER — Other Ambulatory Visit: Payer: Self-pay

## 2020-02-01 VITALS — BP 107/64 | HR 54 | Temp 97.2°F | Resp 14 | Ht 60.0 in | Wt 139.0 lb

## 2020-02-01 DIAGNOSIS — K635 Polyp of colon: Secondary | ICD-10-CM | POA: Diagnosis not present

## 2020-02-01 DIAGNOSIS — K621 Rectal polyp: Secondary | ICD-10-CM | POA: Diagnosis not present

## 2020-02-01 DIAGNOSIS — Z1211 Encounter for screening for malignant neoplasm of colon: Secondary | ICD-10-CM

## 2020-02-01 DIAGNOSIS — D123 Benign neoplasm of transverse colon: Secondary | ICD-10-CM

## 2020-02-01 DIAGNOSIS — D129 Benign neoplasm of anus and anal canal: Secondary | ICD-10-CM

## 2020-02-01 DIAGNOSIS — Z800 Family history of malignant neoplasm of digestive organs: Secondary | ICD-10-CM

## 2020-02-01 DIAGNOSIS — D128 Benign neoplasm of rectum: Secondary | ICD-10-CM

## 2020-02-01 MED ORDER — SODIUM CHLORIDE 0.9 % IV SOLN: 500.0000 mL | Freq: Once | INTRAVENOUS | Status: DC

## 2020-02-01 NOTE — Progress Notes (Signed)
VS-CW  Pt's states no medical or surgical changes since previsit or office visit.  

## 2020-02-01 NOTE — Progress Notes (Signed)
Report given to PACU, vss 

## 2020-02-01 NOTE — Op Note (Signed)
[facility] Patient Name: Theresa Mack Procedure Date: 02/01/2020 8:32 AM MRN: [id number] Endoscopist: Thornton Park MD, MD Age: 59 Referring MD:  Date of Birth: 25-Mar-1961 Gender: Female Account #: [id number] Procedure:                Colonoscopy Indications:              Screening for colorectal malignant neoplasm                           Colonoscopy with Dr. Collene Mares in 2011 with no polyps                            identified at that time                           Mother diagnosed with colon cancer last year Medicines:                Monitored Anesthesia Care Procedure:                Pre-Anesthesia Assessment:                           - Prior to the procedure, a History and Physical                            was performed, and patient medications and                            allergies were reviewed. The patient's tolerance of                            previous anesthesia was also reviewed. The risks                            and benefits of the procedure and the sedation                            options and risks were discussed with the patient.                            All questions were answered, and informed consent                            was obtained. Prior Anticoagulants: The patient has                            taken no previous anticoagulant or antiplatelet                            agents. ASA Grade Assessment: III - A patient with                            severe systemic disease. After reviewing the risks  and benefits, the patient was deemed in                            satisfactory condition to undergo the procedure.                           After obtaining informed consent, the colonoscope                            was passed under direct vision. Throughout the                            procedure, the patient's blood pressure, pulse, and                            oxygen saturations were monitored  continuously. The                            Colonoscope was introduced through the anus and                            advanced to the 3 cm into the ileum. A second                            forward view of the right colon was performed. The                            colonoscopy was performed without difficulty. The                            patient tolerated the procedure well. The quality                            of the bowel preparation was good. The terminal                            ileum, ileocecal valve, appendiceal orifice, and                            rectum were photographed. Scope In: 8:41:25 AM Scope Out: 8:57:04 AM Scope Withdrawal Time: 0 hours 11 minutes 38 seconds  Total Procedure Duration: 0 hours 15 minutes 39 seconds  Findings:                 The perianal and digital rectal examinations were                            normal.                           Multiple small and large-mouthed diverticula were                            found in the sigmoid colon, descending colon and  ascending colon.                           Two sessile polyps were found in the rectum. The                            polyps were 2-3 mm in size. These polyps were                            removed with a cold snare. Resection and retrieval                            were complete. Estimated blood loss: none.                           Two flat polyps were found in the rectum and                            hepatic flexure. The polyps were 1 mm in size.                            These polyps were removed with a cold biopsy                            forceps. Resection and retrieval were complete.                            Estimated blood loss was minimal.                           The exam was otherwise without abnormality on                            direct and retroflexion views. Complications:            No immediate complications. Estimated blood loss:                             Minimal. Estimated Blood Loss:     Estimated blood loss was minimal. Impression:               - Diverticulosis in the sigmoid colon, in the                            descending colon and in the ascending colon.                           - Two 2-3 mm polyps in the rectum, removed with a                            cold snare. Resected and retrieved.                           - Two 1 mm polyps in the rectum and at the hepatic  flexure, removed with a cold biopsy forceps.                            Resected and retrieved.                           - The examination was otherwise normal on direct                            and retroflexion views. Recommendation:           - Patient has a contact number available for                            emergencies. The signs and symptoms of potential                            delayed complications were discussed with the                            patient. Return to normal activities tomorrow.                            Written discharge instructions were provided to the                            patient.                           - Follow a high fiber diet. Drink at least 64                            ounces of water daily. Add a daily stool bulking                            agent such as psyllium (an exampled would be                            Metamucil).                           - Continue present medications.                           - Await pathology results.                           - Repeat colonoscopy date to be determined after                            pending pathology results are reviewed for                            surveillance.                           - Emerging  evidence supports eating a diet of                            fruits, vegetables, grains, calcium, and yogurt                            while reducing red meat and alcohol may reduce the                             risk of colon cancer.                           - Thank you for allowing me to be involved in your                            colon cancer prevention. Thornton Park MD, MD 02/01/2020 9:02:51 AM This report has been signed electronically.

## 2020-02-01 NOTE — Patient Instructions (Signed)
HANDOUTS PROVIDED ON: POLYPS, DIVERTICULOSIS, & HIGH FIBER DIET  The polyps removed today have been sent for pathology.  The results can take 1-3 weeks to receive.  When your next colonoscopy should occur will be based on the pathology results.    You may resume your medication schedule.    Follow a high fiber diet and drink at least 64 oz of water daily as well as adding in Metamucil or some other OTC stool bulking agent.  Thank you for allowing [place] to care for you today!!!   YOU HAD AN ENDOSCOPIC PROCEDURE TODAY AT [facility]:   Refer to the procedure report that was given to you for any specific questions about what was found during the examination.  If the procedure report does not answer your questions, please call your gastroenterologist to clarify.  If you requested that your care partner not be given the details of your procedure findings, then the procedure report has been included in a sealed envelope for you to review at your convenience later.  YOU SHOULD EXPECT: Some feelings of bloating in the abdomen. Passage of more gas than usual.  Walking can help get rid of the air that was put into your GI tract during the procedure and reduce the bloating. If you had a lower endoscopy (such as a colonoscopy or flexible sigmoidoscopy) you may notice spotting of blood in your stool or on the toilet paper. If you underwent a bowel prep for your procedure, you may not have a normal bowel movement for a few days.  Please Note:  You might notice some irritation and congestion in your nose or some drainage.  This is from the oxygen used during your procedure.  There is no need for concern and it should clear up in a day or so.  SYMPTOMS TO REPORT IMMEDIATELY:   Following lower endoscopy (colonoscopy or flexible sigmoidoscopy):  Excessive amounts of blood in the stool  Significant tenderness or worsening of abdominal pains  Swelling of the abdomen that is new, acute  Fever of  100F or higher  For urgent or emergent issues, a gastroenterologist can be reached at any hour by calling [phone]. Do not use MyChart messaging for urgent concerns.    DIET:  We do recommend a small meal at first, but then you may proceed to your regular diet.  Drink plenty of fluids but you should avoid alcoholic beverages for 24 hours.  ACTIVITY:  You should plan to take it easy for the rest of today and you should NOT DRIVE or use heavy machinery until tomorrow (because of the sedation medicines used during the test).    FOLLOW UP: Our staff will call the number listed on your records 48-72 hours following your procedure to check on you and address any questions or concerns that you may have regarding the information given to you following your procedure. If we do not reach you, we will leave a message.  We will attempt to reach you two times.  During this call, we will ask if you have developed any symptoms of COVID 19. If you develop any symptoms (ie: fever, flu-like symptoms, shortness of breath, cough etc.) before then, please call [phone].  If you test positive for Covid 19 in the 2 weeks post procedure, please call and report this information to [place].    If any biopsies were taken you will be contacted by phone or by letter within the next 1-3 weeks.  Please call  us at [phone] if you have not heard about the biopsies in 3 weeks.    SIGNATURES/CONFIDENTIALITY: You and/or your care partner have signed paperwork which will be entered into your electronic medical record.  These signatures attest to the fact that that the information above on your After Visit Summary has been reviewed and is understood.  Full responsibility of the confidentiality of this discharge information lies with you and/or your care-partner.

## 2020-02-01 NOTE — Progress Notes (Signed)
Called to room to assist during endoscopic procedure.  Patient ID and intended procedure confirmed with present staff. Received instructions for my participation in the procedure from the performing physician.  

## 2020-02-05 ENCOUNTER — Telehealth: Payer: Self-pay

## 2020-02-05 ENCOUNTER — Encounter: Payer: Self-pay | Admitting: Gastroenterology

## 2020-02-05 NOTE — Telephone Encounter (Signed)
  Follow up Call-  Call back number 02/01/2020  Post procedure Call Back phone  # [phone]  Permission to leave phone message Yes  Some recent data might be hidden     Patient questions:  Do you have a fever, pain , or abdominal swelling? No. Pain Score  0 *  Have you tolerated food without any problems? Yes.    Have you been able to return to your normal activities? Yes.    Do you have any questions about your discharge instructions: Diet   No. Medications  No. Follow up visit  No.  Do you have questions or concerns about your Care? No.  Actions: * If pain score is 4 or above: No action needed, pain <4.  1. Have you developed a fever since your procedure? no  2.   Have you had an respiratory symptoms (SOB or cough) since your procedure? no  3.   Have you tested positive for COVID 19 since your procedure no  4.   Have you had any family members/close contacts diagnosed with the COVID 19 since your procedure?  no   If yes to any of these questions please route to Joylene John, RN and Joella Prince, RN

## 2020-02-22 ENCOUNTER — Other Ambulatory Visit: Payer: Self-pay | Admitting: Family Medicine

## 2020-02-22 DIAGNOSIS — Z8669 Personal history of other diseases of the nervous system and sense organs: Secondary | ICD-10-CM

## 2020-02-22 NOTE — Telephone Encounter (Signed)
[facility]. Please advise [facility]

## 2020-02-26 ENCOUNTER — Ambulatory Visit: Payer: Managed Care, Other (non HMO) | Admitting: Family Medicine

## 2020-02-26 ENCOUNTER — Other Ambulatory Visit: Payer: Self-pay

## 2020-02-26 ENCOUNTER — Encounter: Payer: Self-pay | Admitting: Family Medicine

## 2020-02-26 VITALS — BP 118/70 | HR 61 | Temp 99.1°F | Wt 140.2 lb

## 2020-02-26 DIAGNOSIS — M7581 Other shoulder lesions, right shoulder: Secondary | ICD-10-CM

## 2020-02-26 MED ORDER — LIDOCAINE HCL 2 % IJ SOLN
2.0000 mL | Freq: Once | INTRAMUSCULAR | Status: AC
  Administered 2020-02-26: 40 mg via INTRADERMAL

## 2020-02-26 MED ORDER — TRIAMCINOLONE ACETONIDE 40 MG/ML IJ SUSP
40.0000 mg | Freq: Once | INTRAMUSCULAR | Status: AC
  Administered 2020-02-26: 40 mg via INTRAMUSCULAR

## 2020-02-26 NOTE — Progress Notes (Signed)
   Subjective:    Patient ID: Theresa Mack, female    DOB: 12-31-1960, 59 y.o.   MRN: [id number]  HPI She complains of a 1 year history of right shoulder pain that has been intermittent in nature but usually bothers her more when she abducts and externally rotates her right shoulder.  She is right-handed.  She is getting ready to go on a fishing trip and casting a fishing rod does cause some difficulty.  No numbness, tingling, weakness but she does note occasional grinding sensation.   Review of Systems     Objective:   Physical Exam Full motion of the shoulder with pain with abduction and external rotation.  No palpable tenderness.  Neer's and Hawkins test was uncomfortable with a grinding sensation appreciated.  Negative sulcus sign.       Assessment & Plan:  Tendinitis of right rotator cuff I explained the mechanism of the injury and options on treatment including conservative use of NSAIDs versus more aggressive with steroids.  Since she is going on a fishing trip, she prefers to have an injection.  The right shoulder was prepped posteriorly with Betadine.  40 mg of Kenalog and 3 cc of Xylocaine was injected into the subacromial bursa without difficulty.  She did obtain relief of her symptoms.  She will be seen here again for a complete exam in several weeks and we will discuss possible physical therapy for her.  She was comfortable with that.

## 2020-02-26 NOTE — Addendum Note (Signed)
Addended by: Elyse Jarvis on: 02/26/2020 02:19 PM   Modules accepted: Orders

## 2020-02-29 ENCOUNTER — Encounter: Payer: Managed Care, Other (non HMO) | Admitting: Family Medicine

## 2020-03-13 ENCOUNTER — Encounter: Payer: Self-pay | Admitting: Family Medicine

## 2020-03-13 ENCOUNTER — Other Ambulatory Visit: Payer: Self-pay

## 2020-03-13 ENCOUNTER — Ambulatory Visit (INDEPENDENT_AMBULATORY_CARE_PROVIDER_SITE_OTHER): Payer: Managed Care, Other (non HMO) | Admitting: Family Medicine

## 2020-03-13 VITALS — BP 118/74 | HR 56 | Temp 98.4°F | Ht 59.0 in | Wt 136.8 lb

## 2020-03-13 DIAGNOSIS — Z8673 Personal history of transient ischemic attack (TIA), and cerebral infarction without residual deficits: Secondary | ICD-10-CM

## 2020-03-13 DIAGNOSIS — Z87891 Personal history of nicotine dependence: Secondary | ICD-10-CM

## 2020-03-13 DIAGNOSIS — Z0000 Encounter for general adult medical examination without abnormal findings: Secondary | ICD-10-CM

## 2020-03-13 DIAGNOSIS — Z8669 Personal history of other diseases of the nervous system and sense organs: Secondary | ICD-10-CM

## 2020-03-13 DIAGNOSIS — M8580 Other specified disorders of bone density and structure, unspecified site: Secondary | ICD-10-CM

## 2020-03-13 DIAGNOSIS — Z23 Encounter for immunization: Secondary | ICD-10-CM

## 2020-03-13 DIAGNOSIS — F341 Dysthymic disorder: Secondary | ICD-10-CM

## 2020-03-13 MED ORDER — NADOLOL 40 MG PO TABS: ORAL_TABLET | ORAL | 3 refills | Status: DC

## 2020-03-13 MED ORDER — FLUOXETINE HCL 20 MG PO CAPS: 20.0000 mg | ORAL_CAPSULE | Freq: Every day | ORAL | 3 refills | Status: DC

## 2020-03-13 NOTE — Progress Notes (Signed)
   Subjective:    Patient ID: Theresa Mack, female    DOB: 09-09-1960, 59 y.o.   MRN: [id number]  HPI She is here for complete examination.  She does have a history of migraine headache and is using Corgard with good results.  She did have a CVA from the migraine in the past.  She continues on Prozac to help treat her dysthymia and also needs this to help as she is now taking care of her mother who has moved to this area.  Did have a recent DEXA scan which did show evidence of osteopenia.  She smokes less than a half a pack per day and is in the process of quitting smoking.  Family and social history as well as health maintenance and immunizations was reviewed   Review of Systems  All other systems reviewed and are negative.      Objective:   Physical Exam Alert and in no distress. Tympanic membranes and canals are normal. Pharyngeal area is normal. Neck is supple without adenopathy or thyromegaly. Cardiac exam shows a regular sinus rhythm without murmurs or gallops. Lungs are clear to auscultation.        Assessment & Plan:  Routine general medical examination at a health care facility - Plan: CBC with Differential/Platelet, Comprehensive metabolic panel, Lipid panel  Dysthymia - Plan: FLUoxetine (PROZAC) 20 MG capsule  History of CVA (cerebrovascular accident)  Former smoker  History of migraine headaches - Plan: nadolol (CORGARD) 40 MG tablet  Osteopenia, unspecified location  Need for influenza vaccination - Plan: Flu Vaccine QUAD 36+ mos IM Recommend vitamin D and calcium supplementation.  She will continue on her present medication regimen.  Discussed possibly stopping Prozac but at this point we will not.  Encouraged her to continue with the smoking cessation which she plans to do.

## 2020-03-14 LAB — CBC WITH DIFFERENTIAL/PLATELET
Basophils Absolute: 0 10*3/uL (ref 0.0–0.2)
Basos: 0 %
EOS (ABSOLUTE): 0.1 10*3/uL (ref 0.0–0.4)
Eos: 1 %
Hematocrit: 45.8 % (ref 34.0–46.6)
Hemoglobin: 15.5 g/dL (ref 11.1–15.9)
Immature Grans (Abs): 0 10*3/uL (ref 0.0–0.1)
Immature Granulocytes: 0 %
Lymphocytes Absolute: 1.6 10*3/uL (ref 0.7–3.1)
Lymphs: 20 %
MCH: 32.7 pg (ref 26.6–33.0)
MCHC: 33.8 g/dL (ref 31.5–35.7)
MCV: 97 fL (ref 79–97)
Monocytes Absolute: 0.5 10*3/uL (ref 0.1–0.9)
Monocytes: 7 %
Neutrophils Absolute: 5.8 10*3/uL (ref 1.4–7.0)
Neutrophils: 72 %
Platelets: 317 10*3/uL (ref 150–450)
RBC: 4.74 x10E6/uL (ref 3.77–5.28)
RDW: 12.6 % (ref 11.7–15.4)
WBC: 8 10*3/uL (ref 3.4–10.8)

## 2020-03-14 LAB — COMPREHENSIVE METABOLIC PANEL
ALT: 20 IU/L (ref 0–32)
AST: 23 IU/L (ref 0–40)
Albumin/Globulin Ratio: 2.4 — ABNORMAL HIGH (ref 1.2–2.2)
Albumin: 4.5 g/dL (ref 3.8–4.8)
Alkaline Phosphatase: 63 IU/L (ref 44–121)
BUN/Creatinine Ratio: 13 (ref 12–28)
BUN: 10 mg/dL (ref 8–27)
Bilirubin Total: 1.7 mg/dL — ABNORMAL HIGH (ref 0.0–1.2)
CO2: 23 mmol/L (ref 20–29)
Calcium: 9.5 mg/dL (ref 8.7–10.3)
Chloride: 105 mmol/L (ref 96–106)
Creatinine, Ser: 0.77 mg/dL (ref 0.57–1.00)
GFR calc Af Amer: 96 mL/min/{1.73_m2} (ref >59)
GFR calc non Af Amer: 84 mL/min/{1.73_m2} (ref >59)
Globulin, Total: 1.9 g/dL (ref 1.5–4.5)
Glucose: 93 mg/dL (ref 65–99)
Potassium: 4.3 mmol/L (ref 3.5–5.2)
Sodium: 140 mmol/L (ref 134–144)
Total Protein: 6.4 g/dL (ref 6.0–8.5)

## 2020-03-14 LAB — LIPID PANEL
Chol/HDL Ratio: 4.3 ratio (ref 0.0–4.4)
Cholesterol, Total: 234 mg/dL — ABNORMAL HIGH (ref 100–199)
HDL: 55 mg/dL (ref >39)
LDL Chol Calc (NIH): 159 mg/dL — ABNORMAL HIGH (ref 0–99)
Triglycerides: 112 mg/dL (ref 0–149)
VLDL Cholesterol Cal: 20 mg/dL (ref 5–40)

## 2020-04-12 ENCOUNTER — Other Ambulatory Visit: Payer: Self-pay | Admitting: Family Medicine

## 2020-04-12 DIAGNOSIS — F341 Dysthymic disorder: Secondary | ICD-10-CM

## 2020-04-14 NOTE — Telephone Encounter (Signed)
[facility]. Please advise [facility]

## 2020-07-09 LAB — HM MAMMOGRAPHY

## 2020-07-09 LAB — RESULTS CONSOLE HPV: CHL HPV: NEGATIVE

## 2020-07-09 LAB — HM PAP SMEAR

## 2020-07-17 ENCOUNTER — Encounter: Payer: Self-pay | Admitting: Family Medicine

## 2020-07-17 DIAGNOSIS — Z8669 Personal history of other diseases of the nervous system and sense organs: Secondary | ICD-10-CM

## 2020-07-17 MED ORDER — NADOLOL 40 MG PO TABS: ORAL_TABLET | ORAL | 3 refills | Status: DC

## 2020-10-23 ENCOUNTER — Other Ambulatory Visit: Payer: Self-pay

## 2020-10-23 ENCOUNTER — Emergency Department (HOSPITAL_BASED_OUTPATIENT_CLINIC_OR_DEPARTMENT_OTHER)
Admission: EM | Admit: 2020-10-23 | Discharge: 2020-10-23 | Disposition: A | Discharge status: Home / Self Care | Payer: 59 | Attending: Emergency Medicine | Admitting: Emergency Medicine

## 2020-10-23 ENCOUNTER — Encounter (HOSPITAL_BASED_OUTPATIENT_CLINIC_OR_DEPARTMENT_OTHER): Payer: Self-pay

## 2020-10-23 ENCOUNTER — Emergency Department (HOSPITAL_BASED_OUTPATIENT_CLINIC_OR_DEPARTMENT_OTHER): Payer: 59

## 2020-10-23 DIAGNOSIS — S022XXA Fracture of nasal bones, initial encounter for closed fracture: Secondary | ICD-10-CM | POA: Insufficient documentation

## 2020-10-23 DIAGNOSIS — W1812XA Fall from or off toilet with subsequent striking against object, initial encounter: Secondary | ICD-10-CM | POA: Diagnosis not present

## 2020-10-23 DIAGNOSIS — S0181XA Laceration without foreign body of other part of head, initial encounter: Secondary | ICD-10-CM | POA: Insufficient documentation

## 2020-10-23 DIAGNOSIS — J45909 Unspecified asthma, uncomplicated: Secondary | ICD-10-CM | POA: Diagnosis not present

## 2020-10-23 DIAGNOSIS — S161XXA Strain of muscle, fascia and tendon at neck level, initial encounter: Secondary | ICD-10-CM | POA: Insufficient documentation

## 2020-10-23 DIAGNOSIS — Y92002 Bathroom of unspecified non-institutional (private) residence single-family (private) house as the place of occurrence of the external cause: Secondary | ICD-10-CM | POA: Diagnosis not present

## 2020-10-23 DIAGNOSIS — F17210 Nicotine dependence, cigarettes, uncomplicated: Secondary | ICD-10-CM | POA: Insufficient documentation

## 2020-10-23 DIAGNOSIS — T148XXA Other injury of unspecified body region, initial encounter: Secondary | ICD-10-CM

## 2020-10-23 DIAGNOSIS — R55 Syncope and collapse: Secondary | ICD-10-CM | POA: Insufficient documentation

## 2020-10-23 DIAGNOSIS — S0992XA Unspecified injury of nose, initial encounter: Secondary | ICD-10-CM | POA: Diagnosis present

## 2020-10-23 LAB — BASIC METABOLIC PANEL
Anion gap: 11 (ref 5–15)
BUN: 14 mg/dL (ref 8–23)
CO2: 23 mmol/L (ref 22–32)
Calcium: 9.4 mg/dL (ref 8.9–10.3)
Chloride: 106 mmol/L (ref 98–111)
Creatinine, Ser: 0.61 mg/dL (ref 0.44–1.00)
GFR, Estimated: >60 mL/min (ref >60)
Glucose, Bld: 111 mg/dL — ABNORMAL HIGH (ref 70–99)
Potassium: 3.6 mmol/L (ref 3.5–5.1)
Sodium: 140 mmol/L (ref 135–145)

## 2020-10-23 LAB — CBC
HCT: 41.4 % (ref 36.0–46.0)
Hemoglobin: 14.4 g/dL (ref 12.0–15.0)
MCH: 32 pg (ref 26.0–34.0)
MCHC: 34.8 g/dL (ref 30.0–36.0)
MCV: 92 fL (ref 80.0–100.0)
Platelets: 272 10*3/uL (ref 150–400)
RBC: 4.5 MIL/uL (ref 3.87–5.11)
RDW: 12.7 % (ref 11.5–15.5)
WBC: 10 10*3/uL (ref 4.0–10.5)
nRBC: 0 % (ref 0.0–0.2)

## 2020-10-23 LAB — CBG MONITORING, ED: Glucose-Capillary: 108 mg/dL — ABNORMAL HIGH (ref 70–99)

## 2020-10-23 MED ORDER — SODIUM CHLORIDE 0.9 % IV BOLUS
1000.0000 mL | Freq: Once | INTRAVENOUS | Status: AC
  Administered 2020-10-23: 1000 mL via INTRAVENOUS

## 2020-10-23 MED ORDER — LIDOCAINE-EPINEPHRINE (PF) 2 %-1:200000 IJ SOLN
20.0000 mL | Freq: Once | INTRAMUSCULAR | Status: DC
  Filled 2020-10-23: qty 20

## 2020-10-23 MED ORDER — CYCLOBENZAPRINE HCL 10 MG PO TABS: 5.0000 mg | ORAL_TABLET | Freq: Every day | ORAL | 0 refills | Status: AC

## 2020-10-23 MED ORDER — KETOROLAC TROMETHAMINE 15 MG/ML IJ SOLN
15.0000 mg | Freq: Once | INTRAMUSCULAR | Status: AC
  Administered 2020-10-23: 15 mg via INTRAVENOUS
  Filled 2020-10-23: qty 1

## 2020-10-23 NOTE — ED Triage Notes (Signed)
Pt arrived POV for syncopal episode. Pt states she got up to use the bathroom and had an upset stomach and became diaphoretic and fell forward on the toilet hitting her face on the air vent on the floor. Pt has laceation to right eyebrow and nose.

## 2020-10-23 NOTE — ED Provider Notes (Signed)
[facility] EMERGENCY DEPT Provider Note  CSN: [id number] Arrival date & time: 10/23/20 [id number]  Chief Complaint(s) Near Syncope  HPI Theresa Mack is a 60 y.o. female   The history is provided by the patient.  Loss of Consciousness Episode history:  Single Most recent episode:  Today Duration:  4 minutes Timing: once. Context: bowel movement   Witnessed: no   Relieved by:  Nothing Worsened by:  Nothing Associated symptoms: no chest pain, no confusion, no diaphoresis, no difficulty breathing, no focal sensory loss, no focal weakness, no headaches, no nausea, no palpitations, no rectal bleeding, no shortness of breath, no visual change, no vomiting and no weakness   Associated symptoms comment:  Neck pain and upper back pain  Patient fell forward off the toilet, hitting her face on the floor A/C register and sustained facial lacerations.  Patient reports that she has had near syncopal episodes with BMs in the past.  Past Medical History Past Medical History:  Diagnosis Date  . Asthma    childhood  . Stroke ([place]) 1989  . Tubular adenoma of colon 01/16/2010   colonoscopy Dr. Collene Mares   Patient Active Problem List   Diagnosis Date Noted  . Osteopenia 03/13/2020  . History of abnormal cervical Pap smear 02/27/2019  . Former smoker 11/13/2012  . Dysthymia 11/11/2011  . History of CVA (cerebrovascular accident) 11/11/2011  . History of migraine headaches 11/03/2010  . Calcium oxalate renal stones 11/03/2010   Home Medication(s) Prior to Admission medications   Medication Sig Start Date End Date Taking? Authorizing Provider  cyclobenzaprine (FLEXERIL) 10 MG tablet Take 0.5-1 tablets (5-10 mg total) by mouth at bedtime for 10 days. 10/23/20 11/02/20 Yes Amaziah Ghosh, Grayce Sessions, MD  Calcium Carbonate-Vitamin D (CALCIUM 500 + D PO) Take 500 mg by mouth.      [provider]  Cholecalciferol (VITAMIN D3) 125 MCG (5000 UT) CAPS Take 2,000 Units by mouth.      [provider]  FLUoxetine (PROZAC) 20 MG capsule Take 1 capsule by mouth once daily 04/14/20   Denita Lung, MD  Multiple Vitamins-Minerals (MULTIVITAMIN WITH MINERALS) tablet Take 1 tablet by mouth daily.      [provider]  nadolol (CORGARD) 40 MG tablet TAKE 1 TABLET DAILY 07/17/20   Denita Lung, MD                                                                                                                                    Past Surgical History Past Surgical History:  Procedure Laterality Date  . COLONOSCOPY  2011   Dr. Madelin Headings reported  . POLYPECTOMY  2011   Dr. Collene Mares -reports TA  . WISDOM TOOTH EXTRACTION  1974   Family History Family History  Problem Relation Age of Onset  . Arthritis Mother   . Colon polyps Mother   . Colon cancer Mother 87  . Stroke Father 55  .  Kidney disease Father 37  . Heart attack Father 25  . Diverticulitis Father 60  . Esophageal cancer Neg Hx   . Rectal cancer Neg Hx   . Stomach cancer Neg Hx     Social History Social History   Tobacco Use  . Smoking status: Current Every Day Smoker    Packs/day: 0.25    Years: 30.00    Pack years: 7.50    Types: Cigarettes    Last attempt to quit: 03/24/2016    Years since quitting: 4.5  . Smokeless tobacco: Never Used  Substance Use Topics  . Alcohol use: No  . Drug use: No   Allergies Codeine  Review of Systems Review of Systems  Constitutional: Negative for diaphoresis.  Respiratory: Negative for shortness of breath.   Cardiovascular: Positive for syncope. Negative for chest pain and palpitations.  Gastrointestinal: Negative for nausea and vomiting.  Neurological: Negative for focal weakness, weakness and headaches.  Psychiatric/Behavioral: Negative for confusion.   All other systems are reviewed and are negative for acute change except as noted in the HPI  Physical Exam Vital Signs  I have reviewed the triage vital signs BP 112/72   Pulse 62   Temp 98  F (36.7 C) (Oral)   Resp 18   Ht 5' (1.524 m)   Wt 59 kg   SpO2 100%   BMI 25.39 kg/m   Physical Exam Constitutional:      General: She is not in acute distress.    Appearance: She is well-developed. She is not diaphoretic.  HENT:     Head: Normocephalic and atraumatic.      Right Ear: External ear normal.     Left Ear: External ear normal.     Nose: Nose normal.   Eyes:     General: No scleral icterus.       Right eye: No discharge.        Left eye: No discharge.     Conjunctiva/sclera: Conjunctivae normal.     Pupils: Pupils are equal, round, and reactive to light.  Cardiovascular:     Rate and Rhythm: Normal rate and regular rhythm.     Pulses:          Radial pulses are 2+ on the right side and 2+ on the left side.       Dorsalis pedis pulses are 2+ on the right side and 2+ on the left side.     Heart sounds: Normal heart sounds. No murmur heard. No friction rub. No gallop.   Pulmonary:     Effort: Pulmonary effort is normal. No respiratory distress.     Breath sounds: Normal breath sounds. No stridor. No wheezing.  Abdominal:     General: There is no distension.     Palpations: Abdomen is soft.     Tenderness: There is no abdominal tenderness.  Musculoskeletal:     Cervical back: Normal range of motion and neck supple. Tenderness present. No bony tenderness. Spinous process tenderness and muscular tenderness present.     Thoracic back: No bony tenderness.     Lumbar back: No bony tenderness.     Comments: Clavicles stable. Chest stable to AP/Lat compression. Pelvis stable to Lat compression. No obvious extremity deformity. No chest or abdominal wall contusion.  Skin:    General: Skin is warm and dry.     Findings: No erythema or rash.  Neurological:     Mental Status: She is alert and oriented to person, place, and  time.     Comments: Mental Status:  Alert and oriented to person, place, and time.  Attention and concentration normal.  Speech clear.   Recent memory is intact  Cranial Nerves:  II Visual Fields: Intact to confrontation. Visual fields intact. III, IV, VI: Pupils equal and reactive to light and near. Full eye movement without nystagmus  V Facial Sensation: Normal. No weakness of masticatory muscles  VII: No facial weakness or asymmetry  VIII Auditory Acuity: Grossly normal  IX/X: The uvula is midline; the palate elevates symmetrically  XI: Normal sternocleidomastoid and trapezius strength  XII: The tongue is midline. No atrophy or fasciculations.   Motor System: Muscle Strength: 5/5 and symmetric in the upper and lower extremities. No pronation or drift.  Muscle Tone: Tone and muscle bulk are normal in the upper and lower extremities.   Coordination: No tremor.  Sensation: Intact to light touch.  Gait: Routine gait normal.       ED Results and Treatments Labs (all labs ordered are listed, but only abnormal results are displayed) Labs Reviewed  BASIC METABOLIC PANEL - Abnormal; Notable for the following components:      Result Value   Glucose, Bld 111 (*)    All other components within normal limits  CBG MONITORING, ED - Abnormal; Notable for the following components:   Glucose-Capillary 108 (*)    All other components within normal limits  CBC  URINALYSIS, ROUTINE W REFLEX MICROSCOPIC                                                                                                                         EKG  EKG Interpretation  Date/Time:  Thursday Oct 23 2020 04:55:56 EDT Ventricular Rate:  64 PR Interval:  194 QRS Duration: 79 QT Interval:  444 QTC Calculation: 459 R Axis:   42 Text Interpretation: Sinus rhythm Low voltage, precordial leads Baseline wander in lead(s) V3 No old tracing to compare Confirmed by Addison Lank [phone]) on 10/23/2020 5:38:55 AM      Radiology CT Head Wo Contrast  Result Date: 10/23/2020 CLINICAL DATA:  Syncope with fall and head trauma EXAM: CT HEAD WITHOUT CONTRAST CT  MAXILLOFACIAL WITHOUT CONTRAST CT CERVICAL SPINE WITHOUT CONTRAST TECHNIQUE: Multidetector CT imaging of the head, cervical spine, and maxillofacial structures were performed using the standard protocol without intravenous contrast. Multiplanar CT image reconstructions of the cervical spine and maxillofacial structures were also generated. COMPARISON:  None. FINDINGS: CT HEAD FINDINGS Brain: No evidence of acute infarction, hemorrhage, hydrocephalus, extra-axial collection or mass lesion/mass effect. Small remote cortical infarct at the left occipital temporal junction. There is history of stroke in 1989 per the chart. Vascular: No hyperdense vessel or unexpected calcification. Skull: No calvarial fracture CT MAXILLOFACIAL FINDINGS Osseous: Bilateral nasal arch fracture with comminution and leftward displacement. There is fracturing and buckling of the anterior nasal septum near the cartilaginous bony interface. Soft tissue emphysema is present in the anterior nasal septum. No orbital or ethmoid  continuation. Leftward nasal septal deviation. Negative for mandible fracture or dislocation. Evaluation of the teeth is limited by extensive dental amalgam Orbits: No visible injury Sinuses: Negative for hemosinus Soft tissues: Soft tissue swelling to the nasal bridge and forehead. No opaque foreign body. CT CERVICAL SPINE FINDINGS Alignment: No traumatic malalignment. Degenerative reversal of cervical lordosis with facet mediated anterolisthesis at C3-4, C4-5, C7-T1, and T1-2. Skull base and vertebrae: No acute fracture Soft tissues and spinal canal: No prevertebral fluid or swelling. No visible canal hematoma. Disc levels: Multilevel degenerative disc narrowing and facet spurring. Upper chest: No visible injury IMPRESSION: 1. Bilateral nasal arch and nasal septal fractures with leftward displacement. 2. No evidence of intracranial injury.  No cervical spine fracture. 3. Disc and facet degeneration with multilevel  listhesis. 4. Remote left cerebral infarct. Electronically Signed   By: Monte Fantasia M.D.   On: 10/23/2020 06:14   CT Cervical Spine Wo Contrast  Result Date: 10/23/2020 CLINICAL DATA:  Syncope with fall and head trauma EXAM: CT HEAD WITHOUT CONTRAST CT MAXILLOFACIAL WITHOUT CONTRAST CT CERVICAL SPINE WITHOUT CONTRAST TECHNIQUE: Multidetector CT imaging of the head, cervical spine, and maxillofacial structures were performed using the standard protocol without intravenous contrast. Multiplanar CT image reconstructions of the cervical spine and maxillofacial structures were also generated. COMPARISON:  None. FINDINGS: CT HEAD FINDINGS Brain: No evidence of acute infarction, hemorrhage, hydrocephalus, extra-axial collection or mass lesion/mass effect. Small remote cortical infarct at the left occipital temporal junction. There is history of stroke in 1989 per the chart. Vascular: No hyperdense vessel or unexpected calcification. Skull: No calvarial fracture CT MAXILLOFACIAL FINDINGS Osseous: Bilateral nasal arch fracture with comminution and leftward displacement. There is fracturing and buckling of the anterior nasal septum near the cartilaginous bony interface. Soft tissue emphysema is present in the anterior nasal septum. No orbital or ethmoid continuation. Leftward nasal septal deviation. Negative for mandible fracture or dislocation. Evaluation of the teeth is limited by extensive dental amalgam Orbits: No visible injury Sinuses: Negative for hemosinus Soft tissues: Soft tissue swelling to the nasal bridge and forehead. No opaque foreign body. CT CERVICAL SPINE FINDINGS Alignment: No traumatic malalignment. Degenerative reversal of cervical lordosis with facet mediated anterolisthesis at C3-4, C4-5, C7-T1, and T1-2. Skull base and vertebrae: No acute fracture Soft tissues and spinal canal: No prevertebral fluid or swelling. No visible canal hematoma. Disc levels: Multilevel degenerative disc narrowing and  facet spurring. Upper chest: No visible injury IMPRESSION: 1. Bilateral nasal arch and nasal septal fractures with leftward displacement. 2. No evidence of intracranial injury.  No cervical spine fracture. 3. Disc and facet degeneration with multilevel listhesis. 4. Remote left cerebral infarct. Electronically Signed   By: Monte Fantasia M.D.   On: 10/23/2020 06:14   CT Maxillofacial Wo Contrast  Result Date: 10/23/2020 CLINICAL DATA:  Syncope with fall and head trauma EXAM: CT HEAD WITHOUT CONTRAST CT MAXILLOFACIAL WITHOUT CONTRAST CT CERVICAL SPINE WITHOUT CONTRAST TECHNIQUE: Multidetector CT imaging of the head, cervical spine, and maxillofacial structures were performed using the standard protocol without intravenous contrast. Multiplanar CT image reconstructions of the cervical spine and maxillofacial structures were also generated. COMPARISON:  None. FINDINGS: CT HEAD FINDINGS Brain: No evidence of acute infarction, hemorrhage, hydrocephalus, extra-axial collection or mass lesion/mass effect. Small remote cortical infarct at the left occipital temporal junction. There is history of stroke in 1989 per the chart. Vascular: No hyperdense vessel or unexpected calcification. Skull: No calvarial fracture CT MAXILLOFACIAL FINDINGS Osseous: Bilateral nasal arch fracture with comminution and  leftward displacement. There is fracturing and buckling of the anterior nasal septum near the cartilaginous bony interface. Soft tissue emphysema is present in the anterior nasal septum. No orbital or ethmoid continuation. Leftward nasal septal deviation. Negative for mandible fracture or dislocation. Evaluation of the teeth is limited by extensive dental amalgam Orbits: No visible injury Sinuses: Negative for hemosinus Soft tissues: Soft tissue swelling to the nasal bridge and forehead. No opaque foreign body. CT CERVICAL SPINE FINDINGS Alignment: No traumatic malalignment. Degenerative reversal of cervical lordosis with  facet mediated anterolisthesis at C3-4, C4-5, C7-T1, and T1-2. Skull base and vertebrae: No acute fracture Soft tissues and spinal canal: No prevertebral fluid or swelling. No visible canal hematoma. Disc levels: Multilevel degenerative disc narrowing and facet spurring. Upper chest: No visible injury IMPRESSION: 1. Bilateral nasal arch and nasal septal fractures with leftward displacement. 2. No evidence of intracranial injury.  No cervical spine fracture. 3. Disc and facet degeneration with multilevel listhesis. 4. Remote left cerebral infarct. Electronically Signed   By: Monte Fantasia M.D.   On: 10/23/2020 06:14    Pertinent labs & imaging results that were available during my care of the patient were reviewed by me and considered in my medical decision making (see chart for details).  Medications Ordered in ED Medications  lidocaine-EPINEPHrine (XYLOCAINE W/EPI) 2 %-1:200000 (PF) injection 20 mL (has no administration in time range)  sodium chloride 0.9 % bolus 1,000 mL (1,000 mLs Intravenous New Bag/Given 10/23/20 0501)  ketorolac (TORADOL) 15 MG/ML injection 15 mg (15 mg Intravenous Given 10/23/20 0715)                                                                                                                                    Procedures .Marland KitchenLaceration Repair  Date/Time: 10/23/2020 7:15 AM Performed by: Fatima Blank, MD Authorized by: Fatima Blank, MD   Consent:    Consent obtained:  Verbal   Consent given by:  Patient   Risks discussed:  Infection, need for additional repair, nerve damage, poor wound healing and poor cosmetic result   Alternatives discussed:  Delayed treatment Universal protocol:    Procedure explained and questions answered to patient or proxy's satisfaction: yes     Relevant documents present and verified: yes     Patient identity confirmed:  Verbally with patient and arm band Anesthesia:    Anesthesia method:  Local infiltration   Local  anesthetic:  Lidocaine 2% WITH epi Laceration details:    Location:  Lip   Lip location:  Upper exterior lip   Length (cm):  2.5   Depth (mm):  4 Pre-procedure details:    Preparation:  Patient was prepped and draped in usual sterile fashion and imaging obtained to evaluate for foreign bodies Exploration:    Hemostasis achieved with:  Direct pressure   Imaging outcome: foreign body not noted     Wound extent: no fascia violation noted  and no foreign bodies/material noted     Contaminated: no   Treatment:    Area cleansed with:  Povidone-iodine   Amount of cleaning:  Standard   Irrigation solution:  Sterile saline   Irrigation volume:  200cc   Irrigation method:  Pressure wash   Debridement:  None Skin repair:    Repair method:  Sutures   Suture size:  5-0   Suture material:  Fast-absorbing gut   Suture technique:  Simple interrupted   Number of sutures:  2 Approximation:    Approximation:  Close Repair type:    Repair type:  Simple Post-procedure details:    Dressing:  Antibiotic ointment   Procedure completion:  Tolerated well, no immediate complications .Marland KitchenLaceration Repair  Date/Time: 10/23/2020 7:17 AM Performed by: Fatima Blank, MD Authorized by: Fatima Blank, MD   Anesthesia:    Anesthesia method:  Local infiltration   Local anesthetic:  Lidocaine 2% WITH epi Laceration details:    Location:  Lip   Lip location:  Upper exterior lip   Length (cm):  2   Depth (mm):  6 Pre-procedure details:    Preparation:  Patient was prepped and draped in usual sterile fashion and imaging obtained to evaluate for foreign bodies Exploration:    Wound extent: no foreign bodies/material noted   Treatment:    Area cleansed with:  Povidone-iodine   Amount of cleaning:  Standard   Irrigation solution:  Sterile saline   Irrigation volume:  200cc   Irrigation method:  Pressure wash   Debridement:  None Skin repair:    Repair method:  Sutures   Suture size:   5-0   Suture material:  Fast-absorbing gut   Number of sutures:  2 Approximation:    Approximation:  Close Repair type:    Repair type:  Simple Post-procedure details:    Dressing:  Antibiotic ointment   Procedure completion:  Tolerated .Marland KitchenLaceration Repair  Date/Time: 10/23/2020 7:18 AM Performed by: Fatima Blank, MD Authorized by: Fatima Blank, MD   Laceration details:    Location:  Face   Face location:  Forehead   Length (cm):  2.5   Depth (mm):  2 Pre-procedure details:    Preparation:  Patient was prepped and draped in usual sterile fashion and imaging obtained to evaluate for foreign bodies Exploration:    Wound extent: no foreign bodies/material noted, no muscle damage noted and no vascular damage noted     Contaminated: no   Treatment:    Area cleansed with:  Povidone-iodine   Amount of cleaning:  Standard   Irrigation solution:  Sterile saline   Irrigation volume:  200cc   Irrigation method:  Pressure wash Skin repair:    Repair method:  Tissue adhesive Approximation:    Approximation:  Close Repair type:    Repair type:  Simple Post-procedure details:    Dressing:  Antibiotic ointment   Procedure completion:  Tolerated    (including critical care time)  Medical Decision Making / ED Course I have reviewed the nursing notes for this encounter and the patient's prior records (if available in EHR or on provided paperwork).   Theresa Mack was evaluated in Emergency Department on 10/23/2020 for the symptoms described in the history of present illness. She was evaluated in the context of the global COVID-19 pandemic, which necessitated consideration that the patient might be at risk for infection with the SARS-CoV-2 virus that causes COVID-19. Institutional protocols and algorithms that pertain to  the evaluation of patients at risk for COVID-19 are in a state of rapid change based on information released by regulatory bodies including the CDC  and federal and state organizations. These policies and algorithms were followed during the patient's care in the ED.  Syncopal episode during BM. No melena or hematochezia concerning for GI bleed. EKG nonischemic and w/o dysrhythmias or blocks. No chest pain or SOB concerning for ACS or PE. No focal weakness concerning for CVA. Likely vaso-vagal. But will get labs and provide IVF.  Patient sustained facial trauma and has neck/upper back pain. Cervical collar placed for precaution. Will get CT head/face/C-spine.  Imaging notable for nasal fractures. No ICH or SAH. Known remote left sided CVA. No cervical fractures.  Laceration irrigated and closed as above.       Final Clinical Impression(s) / ED Diagnoses Final diagnoses:  Vasovagal syncope  Facial laceration, initial encounter  Closed fracture of nasal bone, initial encounter  Muscle strain   The patient appears reasonably screened and/or stabilized for discharge and I doubt any other medical condition or other [facility] requiring further screening, evaluation, or treatment in the ED at this time prior to discharge. Safe for discharge with strict return precautions.  Disposition: Discharge  Condition: Good  I have discussed the results, Dx and Tx plan with the patient/family who expressed understanding and agree(s) with the plan. Discharge instructions discussed at length. The patient/family was given strict return precautions who verbalized understanding of the instructions. No further questions at time of discharge.    ED Discharge Orders         Ordered    cyclobenzaprine (FLEXERIL) 10 MG tablet  Daily at bedtime        10/23/20 4496           Follow Up: Leta Baptist, MD [address] [place] [address] [phone]  Call  to schedule an appointment to follow up for nasal fracture and facial lacerations      This chart was dictated using voice recognition software.  Despite best efforts to proofread,   errors can occur which can change the documentation meaning.   Fatima Blank, MD 10/23/20 [phone]

## 2020-10-23 NOTE — Discharge Instructions (Signed)
For pain control you may take at 1000 mg of Tylenol every 8 hours scheduled.  In addition you can take 220 - 440mg  of Aleve twice per day as needed OR 600mg  of ibuprofen every 6 hours as needed.  You may use over-the-counter topical muscle creams such as SalonPas, [employer], Bengay, etc. Please stretch, apply ice or heat (whichever helps), and have massage therapy for additional assistance.

## 2020-10-25 ENCOUNTER — Encounter: Payer: Self-pay | Admitting: Family Medicine

## 2020-10-28 ENCOUNTER — Telehealth: Payer: Self-pay

## 2020-10-28 DIAGNOSIS — F341 Dysthymic disorder: Secondary | ICD-10-CM

## 2020-10-28 MED ORDER — FLUOXETINE HCL 20 MG PO CAPS: 20.0000 mg | ORAL_CAPSULE | Freq: Every day | ORAL | 1 refills | Status: DC

## 2020-10-28 NOTE — Telephone Encounter (Signed)
Pt. Called LM stating she needs a refill on her fluoxetine to the [facility] on [address] Pt. Last apt was 03/13/20 and next apt is 03/16/21.

## 2020-11-18 ENCOUNTER — Encounter: Payer: Self-pay | Admitting: Family Medicine

## 2020-11-18 ENCOUNTER — Ambulatory Visit: Payer: 59 | Admitting: Family Medicine

## 2020-11-18 VITALS — BP 110/70 | HR 67 | Temp 99.2°F | Wt 136.0 lb

## 2020-11-18 DIAGNOSIS — R55 Syncope and collapse: Secondary | ICD-10-CM

## 2020-11-18 DIAGNOSIS — S0181XA Laceration without foreign body of other part of head, initial encounter: Secondary | ICD-10-CM | POA: Diagnosis not present

## 2020-11-18 DIAGNOSIS — S022XXA Fracture of nasal bones, initial encounter for closed fracture: Secondary | ICD-10-CM | POA: Diagnosis not present

## 2020-11-18 NOTE — Progress Notes (Signed)
   Subjective:    Patient ID: Theresa Mack, female    DOB: 07/21/1960, 60 y.o.   MRN: [id number]  HPI She was seen in the emergency room on May 12 after a fall when she sustained a laceration and fracture of her nose.  Review of the record indicates that this sounded like a vasovagal response.  She had the urge to have a BM and went apparently down the hallway and then fell injuring herself.  She did have a similar episode about 2-1/2 years ago and at that time did note to visual changes with tunnel vision and then passing out.  She is on Corgard for her blood pressure.  She does not have symptoms of weakness dizziness or postural changes.   Review of Systems     Objective:   Physical Exam Alert and in no distress.  Exam of the face including the nose does show slight left nasal bony protrusion.  The scarring seems to be healing nicely.       Assessment & Plan:  Vasovagal reaction  Closed fracture of nasal bone, initial encounter  Facial laceration, initial encounter I explained that I thought she had a vasovagal reaction and not necessarily a blood pressure related issue.  She has had no symptoms of syncope, dizziness or weakness making me think this might be blood pressure related. She will follow-up with ENT for possible revision of the nasal fracture.

## 2021-03-16 ENCOUNTER — Telehealth: Payer: Self-pay | Admitting: Family Medicine

## 2021-03-16 ENCOUNTER — Ambulatory Visit (INDEPENDENT_AMBULATORY_CARE_PROVIDER_SITE_OTHER): Payer: 59 | Admitting: Family Medicine

## 2021-03-16 ENCOUNTER — Other Ambulatory Visit: Payer: Self-pay

## 2021-03-16 ENCOUNTER — Encounter: Payer: Self-pay | Admitting: Family Medicine

## 2021-03-16 VITALS — BP 100/70 | HR 58 | Temp 98.9°F | Ht 59.75 in | Wt 138.4 lb

## 2021-03-16 DIAGNOSIS — Z87891 Personal history of nicotine dependence: Secondary | ICD-10-CM

## 2021-03-16 DIAGNOSIS — Z8673 Personal history of transient ischemic attack (TIA), and cerebral infarction without residual deficits: Secondary | ICD-10-CM

## 2021-03-16 DIAGNOSIS — F341 Dysthymic disorder: Secondary | ICD-10-CM

## 2021-03-16 DIAGNOSIS — Z860100 Personal history of colon polyps, unspecified: Secondary | ICD-10-CM | POA: Insufficient documentation

## 2021-03-16 DIAGNOSIS — Z8669 Personal history of other diseases of the nervous system and sense organs: Secondary | ICD-10-CM

## 2021-03-16 DIAGNOSIS — Z0000 Encounter for general adult medical examination without abnormal findings: Secondary | ICD-10-CM | POA: Diagnosis not present

## 2021-03-16 DIAGNOSIS — M8580 Other specified disorders of bone density and structure, unspecified site: Secondary | ICD-10-CM

## 2021-03-16 DIAGNOSIS — N200 Calculus of kidney: Secondary | ICD-10-CM | POA: Diagnosis not present

## 2021-03-16 MED ORDER — NADOLOL 40 MG PO TABS
ORAL_TABLET | ORAL | 3 refills | Status: DC
Start: 2021-03-16 — End: 2021-03-16

## 2021-03-16 MED ORDER — NADOLOL 40 MG PO TABS: ORAL_TABLET | ORAL | 3 refills | Status: DC

## 2021-03-16 MED ORDER — FLUOXETINE HCL 20 MG PO CAPS: 20.0000 mg | ORAL_CAPSULE | Freq: Every day | ORAL | 3 refills | Status: DC

## 2021-03-16 NOTE — Progress Notes (Signed)
   Subjective:    Patient ID: Theresa Mack, female    DOB: [birth year], 60 y.o.   MRN: [id number]  HPI She is here for complete examination.  She does have a history of migraine headaches and gets in roughly on a monthly basis and uses Excedrin for that.  She has a remote history of CVA associated with a migraine.  This was in 1989.  She also has history of colonic polyps and is scheduled for repeat in 2024.  She also has a history of renal stones but has not had any recently.  She is on a multivitamin as well as calcium for her history of osteopenia.  She is up-to-date on her Pap and mammogram.  Her home life is quite stable.  They are getting ready to go on a trip to [place] this winter.  She is now starting to take more care of her mother which does have her stressed.  Otherwise family and social history as well as health maintenance and immunizations was reviewed   Review of Systems  All other systems reviewed and are negative.     Objective:   Physical Exam Alert and in no distress. Tympanic membranes and canals are normal. Pharyngeal area is normal. Neck is supple without adenopathy or thyromegaly. Cardiac exam shows a regular sinus rhythm without murmurs or gallops. Lungs are clear to auscultation. Abdominal exam shows no masses or tenderness w.  Ith normal bowel sounds       Assessment & Plan:  Routine general medical examination at a health care facility - Plan: CBC with Differential/Platelet, Comprehensive metabolic panel, Lipid panel  History of CVA (cerebrovascular accident)  History of migraine headaches - Plan: nadolol (CORGARD) 40 MG tablet  Calcium oxalate renal stones  Osteopenia, unspecified location  Former smoker  Dysthymia - Plan: FLUoxetine (PROZAC) 20 MG capsule  History of colonic polyps Continue on Prozac especially with the role reversal dealing with her mother. Continue on Corgard for her migraines is a seem to be doing quite nicely on the  Corgard. Discussed the treatment of the kidney stones with her and encouraged her to call me if she notes 1 starting so I can start her on pain medication and then subsequently order CT scan.  She was comfortable with that.

## 2021-03-16 NOTE — Telephone Encounter (Signed)
Pt needs the Fluoxetine sent to [facility] at [place] not [facility]

## 2021-03-17 LAB — COMPREHENSIVE METABOLIC PANEL
ALT: 15 IU/L (ref 0–32)
AST: 21 IU/L (ref 0–40)
Albumin/Globulin Ratio: 2 (ref 1.2–2.2)
Albumin: 4.7 g/dL (ref 3.8–4.8)
Alkaline Phosphatase: 69 IU/L (ref 44–121)
BUN/Creatinine Ratio: 14 (ref 12–28)
BUN: 10 mg/dL (ref 8–27)
Bilirubin Total: 1.1 mg/dL (ref 0.0–1.2)
CO2: 22 mmol/L (ref 20–29)
Calcium: 9.5 mg/dL (ref 8.7–10.3)
Chloride: 104 mmol/L (ref 96–106)
Creatinine, Ser: 0.73 mg/dL (ref 0.57–1.00)
Globulin, Total: 2.3 g/dL (ref 1.5–4.5)
Glucose: 86 mg/dL (ref 70–99)
Potassium: 4.4 mmol/L (ref 3.5–5.2)
Sodium: 142 mmol/L (ref 134–144)
Total Protein: 7 g/dL (ref 6.0–8.5)
eGFR: 93 mL/min/{1.73_m2} (ref >59)

## 2021-03-17 LAB — CBC WITH DIFFERENTIAL/PLATELET
Basophils Absolute: 0 10*3/uL (ref 0.0–0.2)
Basos: 1 %
EOS (ABSOLUTE): 0.2 10*3/uL (ref 0.0–0.4)
Eos: 4 %
Hematocrit: 42.5 % (ref 34.0–46.6)
Hemoglobin: 14.8 g/dL (ref 11.1–15.9)
Immature Grans (Abs): 0 10*3/uL (ref 0.0–0.1)
Immature Granulocytes: 0 %
Lymphocytes Absolute: 1.3 10*3/uL (ref 0.7–3.1)
Lymphs: 26 %
MCH: 33.3 pg — ABNORMAL HIGH (ref 26.6–33.0)
MCHC: 34.8 g/dL (ref 31.5–35.7)
MCV: 96 fL (ref 79–97)
Monocytes Absolute: 0.4 10*3/uL (ref 0.1–0.9)
Monocytes: 8 %
Neutrophils Absolute: 3 10*3/uL (ref 1.4–7.0)
Neutrophils: 61 %
Platelets: 278 10*3/uL (ref 150–450)
RBC: 4.44 x10E6/uL (ref 3.77–5.28)
RDW: 12.4 % (ref 11.7–15.4)
WBC: 4.9 10*3/uL (ref 3.4–10.8)

## 2021-03-17 LAB — LIPID PANEL
Chol/HDL Ratio: 4.7 ratio — ABNORMAL HIGH (ref 0.0–4.4)
Cholesterol, Total: 247 mg/dL — ABNORMAL HIGH (ref 100–199)
HDL: 53 mg/dL (ref >39)
LDL Chol Calc (NIH): 165 mg/dL — ABNORMAL HIGH (ref 0–99)
Triglycerides: 161 mg/dL — ABNORMAL HIGH (ref 0–149)
VLDL Cholesterol Cal: 29 mg/dL (ref 5–40)

## 2021-03-22 ENCOUNTER — Encounter: Payer: Self-pay | Admitting: Family Medicine

## 2021-07-10 LAB — HM MAMMOGRAPHY

## 2021-07-13 LAB — RESULTS CONSOLE HPV: CHL HPV: NEGATIVE

## 2021-07-13 LAB — PSA

## 2021-07-16 ENCOUNTER — Encounter: Payer: Self-pay | Admitting: Family Medicine

## 2021-11-22 ENCOUNTER — Encounter: Payer: Self-pay | Admitting: Family Medicine

## 2021-11-23 ENCOUNTER — Other Ambulatory Visit: Payer: Self-pay

## 2021-11-23 DIAGNOSIS — Z8669 Personal history of other diseases of the nervous system and sense organs: Secondary | ICD-10-CM

## 2021-11-23 MED ORDER — NADOLOL 40 MG PO TABS: ORAL_TABLET | ORAL | 1 refills | Status: DC

## 2022-02-17 ENCOUNTER — Encounter: Payer: Self-pay | Admitting: Internal Medicine

## 2022-03-23 ENCOUNTER — Ambulatory Visit (INDEPENDENT_AMBULATORY_CARE_PROVIDER_SITE_OTHER): Payer: Commercial Managed Care - HMO | Admitting: Family Medicine

## 2022-03-23 ENCOUNTER — Encounter: Payer: Self-pay | Admitting: Family Medicine

## 2022-03-23 VITALS — BP 98/60 | HR 57 | Temp 98.1°F | Ht 59.5 in | Wt 132.6 lb

## 2022-03-23 DIAGNOSIS — Z0000 Encounter for general adult medical examination without abnormal findings: Secondary | ICD-10-CM

## 2022-03-23 DIAGNOSIS — Z8669 Personal history of other diseases of the nervous system and sense organs: Secondary | ICD-10-CM | POA: Diagnosis not present

## 2022-03-23 DIAGNOSIS — Z87891 Personal history of nicotine dependence: Secondary | ICD-10-CM

## 2022-03-23 DIAGNOSIS — F341 Dysthymic disorder: Secondary | ICD-10-CM

## 2022-03-23 DIAGNOSIS — Z23 Encounter for immunization: Secondary | ICD-10-CM | POA: Diagnosis not present

## 2022-03-23 DIAGNOSIS — Z8673 Personal history of transient ischemic attack (TIA), and cerebral infarction without residual deficits: Secondary | ICD-10-CM

## 2022-03-23 DIAGNOSIS — Z86010 Personal history of colonic polyps: Secondary | ICD-10-CM

## 2022-03-23 DIAGNOSIS — M8580 Other specified disorders of bone density and structure, unspecified site: Secondary | ICD-10-CM

## 2022-03-23 DIAGNOSIS — N200 Calculus of kidney: Secondary | ICD-10-CM

## 2022-03-23 DIAGNOSIS — Z8742 Personal history of other diseases of the female genital tract: Secondary | ICD-10-CM

## 2022-03-23 DIAGNOSIS — E785 Hyperlipidemia, unspecified: Secondary | ICD-10-CM

## 2022-03-23 LAB — POCT URINALYSIS DIP (PROADVANTAGE DEVICE)
Bilirubin, UA: NEGATIVE
Blood, UA: NEGATIVE
Glucose, UA: NEGATIVE mg/dL
Nitrite, UA: NEGATIVE
Specific Gravity, Urine: 1.02
Urobilinogen, Ur: 0.2
pH, UA: 6 (ref 5.0–8.0)

## 2022-03-23 LAB — COMPREHENSIVE METABOLIC PANEL
ALT: 12 IU/L (ref 0–32)
AST: 22 IU/L (ref 0–40)
Albumin/Globulin Ratio: 2.1 (ref 1.2–2.2)
Albumin: 4.5 g/dL (ref 3.9–4.9)
Alkaline Phosphatase: 73 IU/L (ref 44–121)
BUN/Creatinine Ratio: 14 (ref 12–28)
BUN: 10 mg/dL (ref 8–27)
Bilirubin Total: 1.3 mg/dL — ABNORMAL HIGH (ref 0.0–1.2)
CO2: 23 mmol/L (ref 20–29)
Calcium: 9.6 mg/dL (ref 8.7–10.3)
Chloride: 104 mmol/L (ref 96–106)
Creatinine, Ser: 0.74 mg/dL (ref 0.57–1.00)
Globulin, Total: 2.1 g/dL (ref 1.5–4.5)
Glucose: 86 mg/dL (ref 70–99)
Potassium: 4.5 mmol/L (ref 3.5–5.2)
Sodium: 142 mmol/L (ref 134–144)
Total Protein: 6.6 g/dL (ref 6.0–8.5)
eGFR: 91 mL/min/{1.73_m2} (ref >59)

## 2022-03-23 LAB — CBC WITH DIFFERENTIAL/PLATELET
Basophils Absolute: 0 10*3/uL (ref 0.0–0.2)
Basos: 1 %
EOS (ABSOLUTE): 0.1 10*3/uL (ref 0.0–0.4)
Eos: 3 %
Hematocrit: 43.2 % (ref 34.0–46.6)
Hemoglobin: 14.5 g/dL (ref 11.1–15.9)
Immature Grans (Abs): 0 10*3/uL (ref 0.0–0.1)
Immature Granulocytes: 0 %
Lymphocytes Absolute: 1.1 10*3/uL (ref 0.7–3.1)
Lymphs: 25 %
MCH: 32.2 pg (ref 26.6–33.0)
MCHC: 33.6 g/dL (ref 31.5–35.7)
MCV: 96 fL (ref 79–97)
Monocytes Absolute: 0.3 10*3/uL (ref 0.1–0.9)
Monocytes: 7 %
Neutrophils Absolute: 2.9 10*3/uL (ref 1.4–7.0)
Neutrophils: 64 %
Platelets: 305 10*3/uL (ref 150–450)
RBC: 4.51 x10E6/uL (ref 3.77–5.28)
RDW: 12.4 % (ref 11.7–15.4)
WBC: 4.5 10*3/uL (ref 3.4–10.8)

## 2022-03-23 LAB — LIPID PANEL
Chol/HDL Ratio: 4.1 ratio (ref 0.0–4.4)
Cholesterol, Total: 224 mg/dL — ABNORMAL HIGH (ref 100–199)
HDL: 55 mg/dL (ref >39)
LDL Chol Calc (NIH): 151 mg/dL — ABNORMAL HIGH (ref 0–99)
Triglycerides: 102 mg/dL (ref 0–149)
VLDL Cholesterol Cal: 18 mg/dL (ref 5–40)

## 2022-03-23 MED ORDER — FLUOXETINE HCL 20 MG PO CAPS: 20.0000 mg | ORAL_CAPSULE | Freq: Every day | ORAL | 3 refills | Status: DC

## 2022-03-23 MED ORDER — NADOLOL 40 MG PO TABS: ORAL_TABLET | ORAL | 3 refills | Status: DC

## 2022-03-23 NOTE — Patient Instructions (Addendum)
Health Maintenance, Female Adopting a healthy lifestyle and getting preventive care are important in promoting health and wellness. Ask your health care provider about: The right schedule for you to have regular tests and exams. Things you can do on your own to prevent diseases and keep yourself healthy. What should I know about diet, weight, and exercise? Eat a healthy diet  Eat a diet that includes plenty of vegetables, fruits, low-fat dairy products, and lean protein. Do not eat a lot of foods that are high in solid fats, added sugars, or sodium. Maintain a healthy weight Body mass index (BMI) is used to identify weight problems. It estimates body fat based on height and weight. Your health care provider can help determine your BMI and help you achieve or maintain a healthy weight. Get regular exercise Get regular exercise. This is one of the most important things you can do for your health. Most adults should: Exercise for at least 150 minutes each week. The exercise should increase your heart rate and make you sweat (moderate-intensity exercise). Do strengthening exercises at least twice a week. This is in addition to the moderate-intensity exercise. Spend less time sitting. Even light physical activity can be beneficial. Watch cholesterol and blood lipids Have your blood tested for lipids and cholesterol at 63 years of age, then have this test every 5 years. Have your cholesterol levels checked more often if: Your lipid or cholesterol levels are high. You are older than 63 years of age. You are at high risk for heart disease. What should I know about cancer screening? Depending on your health history and family history, you may need to have cancer screening at various ages. This may include screening for: Breast cancer. Cervical cancer. Colorectal cancer. Skin cancer. Lung cancer. What should I know about heart disease, diabetes, and high blood pressure? Blood pressure and heart  disease High blood pressure causes heart disease and increases the risk of stroke. This is more likely to develop in people who have high blood pressure readings or are overweight. Have your blood pressure checked: Every 3-5 years if you are 18-39 years of age. Every year if you are 40 years old or older. Diabetes Have regular diabetes screenings. This checks your fasting blood sugar level. Have the screening done: Once every three years after age 40 if you are at a normal weight and have a low risk for diabetes. More often and at a younger age if you are overweight or have a high risk for diabetes. What should I know about preventing infection? Hepatitis B If you have a higher risk for hepatitis B, you should be screened for this virus. Talk with your health care provider to find out if you are at risk for hepatitis B infection. Hepatitis C Testing is recommended for: Everyone born from 1945 through 1965. Anyone with known risk factors for hepatitis C. Sexually transmitted infections (STIs) Get screened for STIs, including gonorrhea and chlamydia, if: You are sexually active and are younger than 63 years of age. You are older than 63 years of age and your health care provider tells you that you are at risk for this type of infection. Your sexual activity has changed since you were last screened, and you are at increased risk for chlamydia or gonorrhea. Ask your health care provider if you are at risk. Ask your health care provider about whether you are at high risk for HIV. Your health care provider may recommend a prescription medicine to help prevent HIV   infection. If you choose to take medicine to prevent HIV, you should first get tested for HIV. You should then be tested every 3 months for as long as you are taking the medicine. Pregnancy If you are about to stop having your period (premenopausal) and you may become pregnant, seek counseling before you get pregnant. Take 400 to 800  micrograms (mcg) of folic acid every day if you become pregnant. Ask for birth control (contraception) if you want to prevent pregnancy. Osteoporosis and menopause Osteoporosis is a disease in which the bones lose minerals and strength with aging. This can result in bone fractures. If you are 57 years old or older, or if you are at risk for osteoporosis and fractures, ask your health care provider if you should: Be screened for bone loss. Take a calcium or vitamin D supplement to lower your risk of fractures. Be given hormone replacement therapy (HRT) to treat symptoms of menopause. Follow these instructions at home: Alcohol use Do not drink alcohol if: Your health care provider tells you not to drink. You are pregnant, may be pregnant, or are planning to become pregnant. If you drink alcohol: Limit how much you have to: 0-1 drink a day. Know how much alcohol is in your drink. In the [place] one drink equals one 12 oz bottle of beer (355 mL), one 5 oz glass of wine (148 mL), or one 1 oz glass of hard liquor (44 mL). Lifestyle Do not use any products that contain nicotine or tobacco. These products include cigarettes, chewing tobacco, and vaping devices, such as e-cigarettes. If you need help quitting, ask your health care provider. Do not use street drugs. Do not share needles. Ask your health care provider for help if you need support or information about quitting drugs. General instructions Schedule regular health, dental, and eye exams. Stay current with your vaccines. Tell your health care provider if: You often feel depressed. You have ever been abused or do not feel safe at home. Summary Adopting a healthy lifestyle and getting preventive care are important in promoting health and wellness. Follow your health care provider's instructions about healthy diet, exercising, and getting tested or screened for diseases. Follow your health care provider's instructions on monitoring your  cholesterol and blood pressure. This information is not intended to replace advice given to you by your health care provider. Make sure you discuss any questions you have with your health care provider. Document Revised: 10/20/2020 Document Reviewed: 10/20/2020 Elsevier Patient Education  [place]. Urinary Incontinence Urinary incontinence refers to a condition in which a person is unable to control where and when to pass urine. A person with this condition will urinate involuntarily. This means that the person urinates when he or she does not mean to. What are the causes? This condition may be caused by: Medicines. Infections. Constipation. Overactive bladder muscles. Weak bladder muscles. Weak pelvic floor muscles. These muscles provide support for the bladder, intestine, and, in women, the uterus. Enlarged prostate in men. The prostate is a gland near the bladder. When it gets too big, it can pinch the urethra. With the urethra blocked, the bladder can weaken and lose the ability to empty properly. Surgery. Emotional factors, such as anxiety, stress, or post-traumatic stress disorder (PTSD). Spinal cord injury, nerve injury, or other neurological conditions. Pelvic organ prolapse. This happens in women when organs move out of place and into the vagina. This movement can prevent the bladder and urethra from working properly. What increases the  risk? The following factors may make you more likely to develop this condition: Age. The older you are, the higher the risk. Obesity. Being physically inactive. Pregnancy and childbirth. Menopause. Diseases that affect the nerves or spinal cord. Long-term, or chronic, coughing. This can increase pressure on the bladder and pelvic floor muscles. What are the signs or symptoms? Symptoms may vary depending on the type of urinary incontinence you have. They include: A sudden urge to urinate, and passing urine involuntarily before you can  get to a bathroom (urge incontinence). Suddenly passing urine when doing activities that force urine to pass, such as coughing, laughing, exercising, or sneezing (stress incontinence). Needing to urinate often but urinating only a small amount, or constantly dribbling urine (overflow incontinence). Urinating because you cannot get to the bathroom in time due to a physical disability, such as arthritis or injury, or due to a communication or thinking problem, such as Alzheimer's disease (functional incontinence). How is this diagnosed? This condition may be diagnosed based on: Your medical history. A physical exam. Tests, such as: Urine tests. X-rays of your kidney and bladder. Ultrasound. CT scan. Cystoscopy. In this procedure, a health care provider inserts a tube with a light and camera (cystoscope) through the urethra and into the bladder to check for problems. Urodynamic testing. These tests assess how well the bladder, urethra, and sphincter can store and release urine. There are different types of urodynamic tests, and they vary depending on what the test is measuring. To help diagnose your condition, your health care provider may recommend that you keep a log of when you urinate and how much you urinate. How is this treated? Treatment for this condition depends on the type of incontinence that you have and its cause. Treatment may include: Lifestyle changes, such as: Quitting smoking. Maintaining a healthy weight. Staying active. Try to get 150 minutes of moderate-intensity exercise every week. Ask your health care provider which activities are safe for you. Eating a healthy diet. Avoid high-fat foods, like fried foods. Avoid refined carbohydrates like white bread and white rice. Limit how much alcohol and caffeine you drink. Increase your fiber intake. Healthy sources of fiber include beans, whole grains, and fresh fruits and vegetables. Behavioral changes, such as: Pelvic floor  muscle exercises. Bladder training, such as lengthening the amount of time between bathroom breaks, or using the bathroom at regular intervals. Using techniques to suppress bladder urges. This can include distraction techniques or controlled breathing exercises. Medicines, such as: Medicines to relax the bladder muscles and prevent bladder spasms. Medicines to help slow or prevent the growth of a man's prostate. Botox injections. These can help relax the bladder muscles. Treatments, such as: Using pulses of electricity to help change bladder reflexes (electrical nerve stimulation). For women, using a medical device to prevent urine leaks. This is a small, tampon-like, disposable device that is inserted into the urethra. Injecting collagen or carbon beads (bulking agents) into the urinary sphincter. These can help thicken tissue and close the bladder opening. Surgery. Follow these instructions at home: Lifestyle Limit alcohol and caffeine. These can fill your bladder quickly and irritate it. Keep yourself clean to help prevent odors and skin damage. Ask your health care provider about special skin creams and cleansers that can protect the skin from urine. Consider wearing pads or adult diapers. Make sure to change them regularly, and always change them right after experiencing incontinence. General instructions Take over-the-counter and prescription medicines only as told by your health care provider. Use  the bathroom about every 3-4 hours, even if you do not feel the need to urinate. Try to empty your bladder completely every time. After urinating, wait a minute. Then try to urinate again. Make sure you are in a relaxed position while urinating. If your incontinence is caused by nerve problems, keep a log of the medicines you take and the times you go to the bathroom. Keep all follow-up visits. This is important. Where to find more information [employer] of Diabetes and Digestive and  Kidney Diseases: [employer] American Urology Association: www.urologyhealth.org Contact a health care provider if: You have pain that gets worse. Your incontinence gets worse. Get help right away if: You have a fever or chills. You are unable to urinate. You have redness in your groin area or down your legs. Summary Urinary incontinence refers to a condition in which a person is unable to control where and when to pass urine. This condition may be caused by medicines, infection, weak bladder muscles, weak pelvic floor muscles, enlargement of the prostate (in men), or surgery. Factors such as older age, obesity, pregnancy and childbirth, menopause, neurological diseases, and chronic coughing may increase your risk for developing this condition. Types of urinary incontinence include urge incontinence, stress incontinence, overflow incontinence, and functional incontinence. This condition is usually treated first with lifestyle and behavioral changes, such as quitting smoking, eating a healthier diet, and doing regular pelvic floor exercises. Other treatment options include medicines, bulking agents, medical devices, electrical nerve stimulation, or surgery. This information is not intended to replace advice given to you by your health care provider. Make sure you discuss any questions you have with your health care provider. Document Revised: 01/04/2020 Document Reviewed: 01/04/2020 Elsevier Patient Education  [place].

## 2022-03-23 NOTE — Progress Notes (Signed)
imm  Complete physical exam  Patient: Theresa Mack   DOB: [birth year]   61 y.o. Female  MRN: [id number]  Subjective:    Chief Complaint  Patient presents with   Annual Exam    Fasting     Theresa Mack is a 61 y.o. female who presents today for a complete physical exam. She reports consuming a general diet. Home exercise routine includes stretching, walking 1 hrs per day, and yoga. She generally feels well. She reports sleeping fairly well. She does have additional problems to discuss today.  She does complain of some popping sensation in her knees but is having no pain with this.  She also complains of urinary frequency and does admit to having some urge incontinence in relation to this.  She is a former smoker.  There is a family history of colon cancer and she does get regular colonoscopies.  She has remote history of CVA.  This was probably related to her migraines.  She is taking nadolol for that.  She continues to do quite nicely on her Prozac which keeps her psychologically stable.  She has had routine Pap smears.  She does have history of osteopenia.  Also has a previous history of kidney stones.  Family and social history was reviewed.   Most recent fall risk assessment:    01/19/2016    8:21 AM  Fall Risk   Falls in the past year? No     Most recent depression screenings:    03/23/2022    8:29 AM 03/16/2021   10:01 AM  PHQ 2/9 Scores  PHQ - 2 Score 0 0  PHQ- 9 Score 1 0      Patient Active Problem List   Diagnosis Date Noted   History of colonic polyps 03/16/2021   Osteopenia 03/13/2020   History of abnormal cervical Pap smear 02/27/2019   Former smoker 11/13/2012   Dysthymia 11/11/2011   History of CVA (cerebrovascular accident) 11/11/2011   History of migraine headaches 11/03/2010   Calcium oxalate renal stones 11/03/2010   Past Medical History:  Diagnosis Date   Asthma    childhood   Stroke [facility]) 1989   Tubular adenoma of colon 01/16/2010    colonoscopy Dr. Loreta Ave   Past Surgical History:  Procedure Laterality Date   COLONOSCOPY  2011   Dr. Lanier Ensign reported   POLYPECTOMY  2011   Dr. Loreta Ave -reports TA   WISDOM TOOTH EXTRACTION  1974   Social History   Tobacco Use   Smoking status: Former    Packs/day: 0.25    Years: 30.00    Total pack years: 7.50    Types: Cigarettes    Quit date: 02/12/2020    Years since quitting: 2.1   Smokeless tobacco: Never  Substance Use Topics   Alcohol use: No   Drug use: No   Family History  Problem Relation Age of Onset   Arthritis Mother    Colon polyps Mother    Colon cancer Mother 23   Stroke Father 75   Kidney disease Father 64   Heart attack Father 50   Diverticulitis Father 63   Esophageal cancer Neg Hx    Rectal cancer Neg Hx    Stomach cancer Neg Hx    Allergies  Allergen Reactions   Codeine Nausea Only      Patient Care Team: Ronnald Nian, MD as PCP - General (Family Medicine)   Outpatient Medications Prior to Visit  Medication Sig  Cholecalciferol (VITAMIN D3) 125 MCG (5000 UT) CAPS Take 2,000 Units by mouth.   Multiple Vitamins-Minerals (CENTRUM SILVER ADULT 50+) TABS    [DISCONTINUED] FLUoxetine (PROZAC) 20 MG capsule Take 1 capsule (20 mg total) by mouth daily.   [DISCONTINUED] nadolol (CORGARD) 40 MG tablet TAKE 1 TABLET DAILYTAKE 1 TABLET DAILY   [DISCONTINUED] Calcium Carbonate-Vitamin D (CALCIUM 500 + D PO) Take 500 mg by mouth. (Patient not taking: Reported on 03/16/2021)   [DISCONTINUED] Multiple Vitamins-Minerals (MULTIVITAMIN WITH MINERALS) tablet Take 1 tablet by mouth daily.   No facility-administered medications prior to visit.    Review of Systems  All other systems reviewed and are negative.         Objective:     BP 98/60   Pulse (!) 57   Temp 98.1 F (36.7 C)   Ht 4' 11.5" (1.511 m)   Wt 132 lb 9.6 oz (60.1 kg)   SpO2 96%   BMI 26.33 kg/m  BP Readings from Last 3 Encounters:  03/23/22 98/60  03/16/21 100/70  11/18/20  110/70   Wt Readings from Last 3 Encounters:  03/23/22 132 lb 9.6 oz (60.1 kg)  03/16/21 138 lb 6.4 oz (62.8 kg)  11/18/20 136 lb (61.7 kg)      Physical Exam  Alert and in no distress. Tympanic membranes and canals are normal. Pharyngeal area is normal. Neck is supple without adenopathy or thyromegaly. Cardiac exam shows a regular sinus rhythm without murmurs or gallops. Lungs are clear to auscultation.   Last CBC Lab Results  Component Value Date   WBC 4.9 03/16/2021   HGB 14.8 03/16/2021   HCT 42.5 03/16/2021   MCV 96 03/16/2021   MCH 33.3 (H) 03/16/2021   RDW 12.4 03/16/2021   PLT 278 43/15/4008   Last metabolic panel Lab Results  Component Value Date   GLUCOSE 86 03/16/2021   NA 142 03/16/2021   K 4.4 03/16/2021   CL 104 03/16/2021   CO2 22 03/16/2021   BUN 10 03/16/2021   CREATININE 0.73 03/16/2021   EGFR 93 03/16/2021   CALCIUM 9.5 03/16/2021   PROT 7.0 03/16/2021   ALBUMIN 4.7 03/16/2021   LABGLOB 2.3 03/16/2021   AGRATIO 2.0 03/16/2021   BILITOT 1.1 03/16/2021   ALKPHOS 69 03/16/2021   AST 21 03/16/2021   ALT 15 03/16/2021   ANIONGAP 11 10/23/2020   Last lipids Lab Results  Component Value Date   CHOL 247 (H) 03/16/2021   HDL 53 03/16/2021   LDLCALC 165 (H) 03/16/2021   TRIG 161 (H) 03/16/2021   CHOLHDL 4.7 (H) 03/16/2021   Last vitamin D Lab Results  Component Value Date   VD25OH 35 01/14/2015        Assessment & Plan:    Routine general medical examination at a health care facility - Plan: CBC with Differential/Platelet, Comprehensive metabolic panel, Lipid panel, POCT Urinalysis DIP (Proadvantage Device)  Dysthymia - Plan: FLUoxetine (PROZAC) 20 MG capsule  History of migraine headaches - Plan: nadolol (CORGARD) 40 MG tablet  Need for influenza vaccination - Plan: Flu Vaccine QUAD 24mo+IM (Fluarix, Fluzone & Alfiuria Quad PF)  Need for COVID-19 vaccine - Plan: PFIZER Comirnaty(GRAY TOP)COVID-19 Vaccine  Former smoker  History of  colonic polyps  History of CVA (cerebrovascular accident)  History of abnormal cervical Pap smear  Osteopenia, unspecified location - Plan: DG Bone Density  Calcium oxalate renal stones - Plan: CBC with Differential/Platelet, Comprehensive metabolic panel  Hyperlipidemia, unspecified hyperlipidemia type - Plan: Lipid panel  Immunization History  Administered Date(s) Administered   DTaP 04/14/2005   Influenza Split 03/20/2013, 03/09/2014, 07/04/2015   Influenza,inj,Quad PF,6+ Mos 02/21/2017, 02/23/2018, 02/27/2019, 03/13/2020, 03/23/2022   Influenza-Unspecified 03/11/2021   PFIZER Comirnaty(Gray Top)Covid-19 Tri-Sucrose Vaccine 12/16/2020, 03/23/2022   PFIZER(Purple Top)SARS-COV-2 Vaccination 08/30/2019, 09/24/2019, 04/08/2020   Tdap 06/14/2004, 04/03/2015   Zoster Recombinat (Shingrix) 02/27/2019, 03/21/2019, 05/21/2019    Health Maintenance  Topic Date Due   HIV Screening  03/24/2023 (Originally 08/28/1975)   COVID-19 Vaccine (6 - Pfizer series) 05/18/2022   MAMMOGRAM  07/11/2023   PAP SMEAR-Modifier  07/10/2024   COLONOSCOPY (Pts 45-35yrs Insurance coverage will need to be confirmed)  01/31/2025   TETANUS/TDAP  04/02/2025   INFLUENZA VACCINE  Completed   Hepatitis C Screening  Completed   Zoster Vaccines- Shingrix  Completed   HPV VACCINES  Aged Out    Discussed health benefits of physical activity, and encouraged her to engage in regular exercise appropriate for her age and condition.  I explained that the knee cracking is probably not significant since she is having no pain with that.  Her urinary symptoms could easily be urge incontinence but she is not having a great deal of difficulty with that and is using a pad with good results.  Explained that we can get more aggressive if we need to.  Continue on her present medications.  Discussed treatment of kidney stones in the future and to not hesitate to call me   Recheck 1 year    Jill Alexanders, MD

## 2022-04-05 ENCOUNTER — Encounter: Payer: Self-pay | Admitting: Internal Medicine

## 2022-06-17 ENCOUNTER — Encounter: Payer: Self-pay | Admitting: Podiatry

## 2022-06-17 ENCOUNTER — Ambulatory Visit: Payer: 59 | Admitting: Podiatry

## 2022-06-17 ENCOUNTER — Ambulatory Visit (INDEPENDENT_AMBULATORY_CARE_PROVIDER_SITE_OTHER): Payer: 59

## 2022-06-17 VITALS — BP 106/64 | HR 63

## 2022-06-17 DIAGNOSIS — M2012 Hallux valgus (acquired), left foot: Secondary | ICD-10-CM

## 2022-06-17 DIAGNOSIS — M778 Other enthesopathies, not elsewhere classified: Secondary | ICD-10-CM

## 2022-06-17 DIAGNOSIS — M2011 Hallux valgus (acquired), right foot: Secondary | ICD-10-CM | POA: Diagnosis not present

## 2022-06-17 MED ORDER — DEXAMETHASONE SODIUM PHOSPHATE 120 MG/30ML IJ SOLN
2.0000 mg | Freq: Once | INTRAMUSCULAR | Status: AC
  Administered 2022-06-17: 2 mg via INTRA_ARTICULAR

## 2022-06-18 ENCOUNTER — Encounter: Payer: Self-pay | Admitting: Podiatry

## 2022-06-20 NOTE — Progress Notes (Signed)
Subjective:  Patient ID: Theresa Mack, female    DOB: [birth year],  MRN: [id number] HPI Chief Complaint  Patient presents with   Foot Pain    Dorsal 1st met left - bunion deformity, 2 weeks ago squatted down and felt pain, it has been aching since, tried ice, heat-no help, tylenol   New Patient (Initial Visit)    Est pt 2018    62 y.o. female presents with the above complaint.   ROS: Denies fever chills nausea vomit muscle aches pains calf pain back pain chest pain shortness of breath.  Past Medical History:  Diagnosis Date   Asthma    childhood   Stroke [facility]) 1989   Tubular adenoma of colon 01/16/2010   colonoscopy Dr. Collene Mares   Past Surgical History:  Procedure Laterality Date   COLONOSCOPY  2011   Dr. Madelin Headings reported   POLYPECTOMY  2011   Dr. Collene Mares -reports TA   WISDOM TOOTH EXTRACTION  1974    Current Outpatient Medications:    Cholecalciferol (VITAMIN D3) 125 MCG (5000 UT) CAPS, Take 2,000 Units by mouth., Disp: , Rfl:    FLUoxetine (PROZAC) 20 MG capsule, Take 1 capsule (20 mg total) by mouth daily., Disp: 90 capsule, Rfl: 3   Multiple Vitamins-Minerals (CENTRUM SILVER ADULT 50+) TABS, , Disp: , Rfl:    nadolol (CORGARD) 40 MG tablet, TAKE 1 TABLET DAILYTAKE 1 TABLET DAILY, Disp: 90 tablet, Rfl: 3  Allergies  Allergen Reactions   Codeine Nausea Only   Review of Systems Objective:   Vitals:   06/17/22 0902  BP: 106/64  Pulse: 63    General: Well developed, nourished, in no acute distress, alert and oriented x3   Dermatological: Skin is warm, dry and supple bilateral. Nails x 10 are well maintained; remaining integument appears unremarkable at this time. There are no open sores, no preulcerative lesions, no rash or signs of infection present.  Vascular: Dorsalis Pedis artery and Posterior Tibial artery pedal pulses are 2/4 bilateral with immedate capillary fill time. Pedal hair growth present. No varicosities and no lower extremity edema present bilateral.    Neruologic: Grossly intact via light touch bilateral. Vibratory intact via tuning fork bilateral. Protective threshold with Semmes Wienstein monofilament intact to all pedal sites bilateral. Patellar and Achilles deep tendon reflexes 2+ bilateral. No Babinski or clonus noted bilateral.   Musculoskeletal: No gross boney pedal deformities bilateral. No pain, crepitus, or limitation noted with foot and ankle range of motion bilateral. Muscular strength 5/5 in all groups tested bilateral.  She has significant hallux abductovalgus deformity left appears to be worse than the right with pain on range of motion particularly and range of motion lateral to the extensor tendon at the metatarsal phalangeal joint.  There is mild erythema overlying the hypertrophic medial condyle of the first metatarsal bilateral.  No crepitation on range of motion.  Gait: Unassisted, Nonantalgic.    Radiographs:  Radiographs taken today demonstrate significant hallux abductovalgus deformity bilateral radiographically right looks worse than the left however the left is most symptomatic.  Tibial sesamoid position is approximately 4-5.  Significant dislocation of both joints minimal dorsal spurring.  Assessment & Plan:   Assessment: Hallux valgus deformity hypermobile in nature capsulitis of the first metatarsophalangeal joint left greater than right.  Plan: Discussed etiology pathology conservative surgical therapies at this point we would like to try injection with dexamethasone.  2 mg was injected into the first metatarsophalangeal joint of the left foot after sterile Betadine skin prep.  Tolerated procedure well.  No complications.  Follow-up with her in 4 to 6 weeks     Zeenat Jeanbaptiste T. [place], [place]

## 2022-07-13 DIAGNOSIS — Z124 Encounter for screening for malignant neoplasm of cervix: Secondary | ICD-10-CM | POA: Diagnosis not present

## 2022-07-13 DIAGNOSIS — Z6826 Body mass index (BMI) 26.0-26.9, adult: Secondary | ICD-10-CM | POA: Diagnosis not present

## 2022-07-13 DIAGNOSIS — Z13820 Encounter for screening for osteoporosis: Secondary | ICD-10-CM | POA: Diagnosis not present

## 2022-07-13 DIAGNOSIS — Z1231 Encounter for screening mammogram for malignant neoplasm of breast: Secondary | ICD-10-CM | POA: Diagnosis not present

## 2022-07-13 DIAGNOSIS — Z01419 Encounter for gynecological examination (general) (routine) without abnormal findings: Secondary | ICD-10-CM | POA: Diagnosis not present

## 2022-07-13 LAB — HM DEXA SCAN

## 2022-07-13 LAB — HM MAMMOGRAPHY

## 2022-07-29 ENCOUNTER — Ambulatory Visit: Payer: 59 | Admitting: Podiatry

## 2022-08-03 ENCOUNTER — Ambulatory Visit: Payer: 59 | Admitting: Podiatry

## 2022-08-04 ENCOUNTER — Ambulatory Visit: Payer: 59 | Admitting: Podiatry

## 2022-08-04 VITALS — BP 94/54 | HR 65

## 2022-08-04 DIAGNOSIS — M7752 Other enthesopathy of left foot: Secondary | ICD-10-CM | POA: Diagnosis not present

## 2022-08-06 ENCOUNTER — Encounter: Payer: Self-pay | Admitting: Podiatry

## 2022-08-06 DIAGNOSIS — M7752 Other enthesopathy of left foot: Secondary | ICD-10-CM | POA: Diagnosis not present

## 2022-08-06 MED ORDER — BETAMETHASONE SOD PHOS & ACET 6 (3-3) MG/ML IJ SUSP
3.0000 mg | Freq: Once | INTRAMUSCULAR | Status: AC
  Administered 2022-08-06: 3 mg via INTRA_ARTICULAR

## 2022-08-06 NOTE — Progress Notes (Signed)
   Chief Complaint  Patient presents with   Foot Pain    Patient came in today for a Left foot Capsulitis follow-up, patient is doing better, but still has some pain, rate of pain 5 out of 10, patient would like an injection today,      HPI: 62 y.o. female presenting today for follow-up evaluation of pain and tenderness associated to the left great toe joint.  She was seen here in the office on 06/17/2022 with Dr. Milinda Pointer.  Patient states that overall she does feel significantly better she does have some residual tenderness to the area.  Overall improvement however.  She presents for further treatment and evaluation  Past Medical History:  Diagnosis Date   Asthma    childhood   Stroke [facility]) 1989   Tubular adenoma of colon 01/16/2010   colonoscopy Dr. Collene Mares    Past Surgical History:  Procedure Laterality Date   COLONOSCOPY  2011   Dr. Madelin Headings reported   POLYPECTOMY  2011   Dr. Collene Mares -reports TA   WISDOM TOOTH EXTRACTION  1974    Allergies  Allergen Reactions   Codeine Nausea Only     Physical Exam: General: The patient is alert and oriented x3 in no acute distress.  Dermatology: Skin is warm, dry and supple bilateral lower extremities. Negative for open lesions or macerations.  Vascular: Palpable pedal pulses bilaterally. Capillary refill within normal limits.  Negative for any significant edema or erythema  Neurological: Light touch and protective threshold grossly intact  Musculoskeletal Exam: Hallux valgus deformity noted bilateral.  There continues to be some slight tenderness with palpation range of motion of the first MTP left foot  Assessment: 1.  First MTP capsulitis bilateral complicated by hallux valgus deformity -Overall the patient does have improvement but she has some residual tenderness to the area.  Injection of 0.5 cc Celestone Soluspan injected in the first MTP left foot -Continue wearing good supportive shoes and sneakers to support the medial longitudinal  arch of the foot and offload pressure from the forefoot -Currently the patient's hallux valgus deformity is asymptomatic.  She is able to carry on her daily activity without any impairment.  Continue conservative treatment -Return to clinic as needed      Edrick Kins, DPM [facility]  Dr. Edrick Kins, DPM    [address], [place] [address]                Office [phone]  Fax [phone]

## 2022-09-07 ENCOUNTER — Encounter: Payer: Self-pay | Admitting: Family Medicine

## 2022-09-07 DIAGNOSIS — Z8669 Personal history of other diseases of the nervous system and sense organs: Secondary | ICD-10-CM

## 2022-09-07 MED ORDER — NADOLOL 40 MG PO TABS: ORAL_TABLET | ORAL | 1 refills | Status: DC

## 2022-12-27 ENCOUNTER — Encounter: Payer: Self-pay | Admitting: Podiatry

## 2022-12-27 ENCOUNTER — Ambulatory Visit: Payer: 59 | Admitting: Podiatry

## 2022-12-27 DIAGNOSIS — M7752 Other enthesopathy of left foot: Secondary | ICD-10-CM

## 2022-12-27 MED ORDER — TRIAMCINOLONE ACETONIDE 10 MG/ML IJ SUSP
10.0000 mg | Freq: Once | INTRAMUSCULAR | Status: AC
  Administered 2022-12-27: 10 mg

## 2022-12-28 NOTE — Progress Notes (Signed)
Subjective:   Patient ID: Theresa Mack, female   DOB: 62 y.o.   MRN: [id number]   HPI Patient is developed a lot of pain on the outside of the left foot states it has been inflamed   ROS      Objective:  Physical Exam  Neurovascular status intact inflammation around the fifth MPJ left fluid buildup with the first MPJ is doing pretty well at this point     Assessment:  Tori capsulitis fifth MPJ left with moderate hallux limitus HAV deformity bilateral     Plan:  Reviewed condition sterile prep injected around the fifth MPJ left both lateral and plantar 3 mg dexamethasone Kenalog 5 mg Xylocaine advised on wider shoes reappoint as symptoms indicate

## 2022-12-30 ENCOUNTER — Encounter: Payer: Self-pay | Admitting: Podiatry

## 2023-01-10 ENCOUNTER — Ambulatory Visit: Payer: 59 | Admitting: Podiatry

## 2023-02-23 ENCOUNTER — Other Ambulatory Visit: Payer: Self-pay | Admitting: Family Medicine

## 2023-02-23 DIAGNOSIS — Z8669 Personal history of other diseases of the nervous system and sense organs: Secondary | ICD-10-CM

## 2023-03-01 DIAGNOSIS — Z8669 Personal history of other diseases of the nervous system and sense organs: Secondary | ICD-10-CM

## 2023-03-01 MED ORDER — NADOLOL 40 MG PO TABS: ORAL_TABLET | ORAL | 0 refills | Status: DC

## 2023-03-30 ENCOUNTER — Encounter: Payer: Self-pay | Admitting: Medical

## 2023-03-30 ENCOUNTER — Ambulatory Visit (INDEPENDENT_AMBULATORY_CARE_PROVIDER_SITE_OTHER): Payer: 59 | Admitting: Medical

## 2023-03-30 VITALS — BP 122/70 | HR 62 | Ht 59.25 in | Wt 134.8 lb

## 2023-03-30 DIAGNOSIS — Z136 Encounter for screening for cardiovascular disorders: Secondary | ICD-10-CM | POA: Diagnosis not present

## 2023-03-30 DIAGNOSIS — Z8673 Personal history of transient ischemic attack (TIA), and cerebral infarction without residual deficits: Secondary | ICD-10-CM

## 2023-03-30 DIAGNOSIS — Z23 Encounter for immunization: Secondary | ICD-10-CM

## 2023-03-30 DIAGNOSIS — Z13220 Encounter for screening for lipoid disorders: Secondary | ICD-10-CM

## 2023-03-30 DIAGNOSIS — Z8669 Personal history of other diseases of the nervous system and sense organs: Secondary | ICD-10-CM

## 2023-03-30 DIAGNOSIS — N39498 Other specified urinary incontinence: Secondary | ICD-10-CM | POA: Diagnosis not present

## 2023-03-30 DIAGNOSIS — R32 Unspecified urinary incontinence: Secondary | ICD-10-CM | POA: Insufficient documentation

## 2023-03-30 DIAGNOSIS — F341 Dysthymic disorder: Secondary | ICD-10-CM | POA: Diagnosis not present

## 2023-03-30 DIAGNOSIS — M8580 Other specified disorders of bone density and structure, unspecified site: Secondary | ICD-10-CM

## 2023-03-30 DIAGNOSIS — Z7185 Encounter for immunization safety counseling: Secondary | ICD-10-CM

## 2023-03-30 DIAGNOSIS — Z0000 Encounter for general adult medical examination without abnormal findings: Secondary | ICD-10-CM | POA: Diagnosis not present

## 2023-03-30 HISTORY — DX: Encounter for general adult medical examination without abnormal findings: Z00.00

## 2023-03-30 LAB — POCT URINALYSIS DIP (PROADVANTAGE DEVICE)
Bilirubin, UA: NEGATIVE
Blood, UA: NEGATIVE
Glucose, UA: NEGATIVE mg/dL
Ketones, POC UA: NEGATIVE mg/dL
Leukocytes, UA: NEGATIVE
Nitrite, UA: NEGATIVE
Protein Ur, POC: NEGATIVE mg/dL
Specific Gravity, Urine: 1.01
Urobilinogen, Ur: NEGATIVE
pH, UA: 6 (ref 5.0–8.0)

## 2023-03-30 NOTE — Progress Notes (Signed)
Subjective:   HPI  Theresa Mack is a 62 y.o. female who presents for Chief Complaint  Patient presents with   Annual Exam    Fasting cpe, urinary issues,     Patient Care Team: Elvi Leventhal, Cleda Mccreedy as PCP - General (Family Medicine) Dr. Gala Lewandowsky, Dr. Cristie Hem, podiatry Dr. Tressia Danas, GI Dr. Varney Baas, [facility] Dr. Jodean Lima, dentist Dr. Blima Ledger, eye doctor   Concerns: She has questions about whether to remain on nadolol.  Been on this long-term because of history of migraines that probably led to the stroke years ago.  This does seem to help control her migraines but the medicine does make her fatigued.  She gets still a few headaches per month  She has been experiencing some urinary incontinence, sometimes urgency.  Not always related to coughing or laughing.  She would like a flu shot today  Reviewed their medical, surgical, family, social, medication, and allergy history and updated chart as appropriate.  Allergies  Allergen Reactions   Codeine Nausea Only    Past Medical History:  Diagnosis Date   Anxiety 1983   on occasion   Arthritis    some in hands   Asthma    childhood   Depression    on occasion   Encounter for health maintenance examination in adult 03/30/2023   Stroke [facility]) 1989   Tubular adenoma of colon 01/16/2010   colonoscopy Dr. Loreta Ave    Current Outpatient Medications on File Prior to Visit  Medication Sig Dispense Refill   FLUoxetine (PROZAC) 20 MG capsule Take 1 capsule (20 mg total) by mouth daily. 90 capsule 3   Multiple Vitamins-Minerals (CENTRUM SILVER ADULT 50+) TABS      nadolol (CORGARD) 40 MG tablet TAKE 1 TABLET DAILYTAKE 1 TABLET DAILY 90 tablet 0   No current facility-administered medications on file prior to visit.      Current Outpatient Medications:    FLUoxetine (PROZAC) 20 MG capsule, Take 1 capsule (20 mg total) by mouth daily., Disp: 90 capsule, Rfl: 3   Multiple  Vitamins-Minerals (CENTRUM SILVER ADULT 50+) TABS, , Disp: , Rfl:    nadolol (CORGARD) 40 MG tablet, TAKE 1 TABLET DAILYTAKE 1 TABLET DAILY, Disp: 90 tablet, Rfl: 0  Family History  Problem Relation Age of Onset   Arthritis Mother    Colon polyps Mother    Colon cancer Mother 41   Anxiety disorder Mother    Cancer Mother    Depression Mother    Hearing loss Mother    Stroke Father 63   Kidney disease Father 73   Heart attack Father 70   Diverticulitis Father 39   Arthritis Father    Heart disease Father    Heart disease Paternal Grandfather    Diabetes Paternal Grandmother    Hearing loss Paternal Grandmother    Heart disease Paternal Aunt    Varicose Veins Paternal Aunt    Esophageal cancer Neg Hx    Rectal cancer Neg Hx    Stomach cancer Neg Hx     Past Surgical History:  Procedure Laterality Date   COLONOSCOPY  2011   Dr. Lanier Ensign reported   POLYPECTOMY  2011   Dr. Loreta Ave -reports TA   WISDOM TOOTH EXTRACTION  1974    Review of Systems  Constitutional:  Negative for chills, fever, malaise/fatigue and weight loss.  HENT:  Negative for congestion, ear pain, hearing loss, sore throat and tinnitus.   Eyes:  Negative  for blurred vision, pain and redness.  Respiratory:  Negative for cough, hemoptysis and shortness of breath.   Cardiovascular:  Negative for chest pain, palpitations, orthopnea, claudication and leg swelling.  Gastrointestinal:  Negative for abdominal pain, blood in stool, constipation, diarrhea, nausea and vomiting.  Genitourinary:  Negative for dysuria, flank pain, frequency, hematuria and urgency.  Musculoskeletal:  Negative for falls, joint pain and myalgias.  Skin:  Negative for itching and rash.  Neurological:  Positive for headaches. Negative for dizziness, tingling, speech change and weakness.  Endo/Heme/Allergies:  Negative for polydipsia. Does not bruise/bleed easily.  Psychiatric/Behavioral:  Negative for depression and memory loss. The patient is  not nervous/anxious and does not have insomnia.         Objective:  BP 122/70   Pulse 62   Ht 4' 11.25" (1.505 m)   Wt 134 lb 12.8 oz (61.1 kg)   BMI 27.00 kg/m   General appearance: alert, no distress, WD/WN, Caucasian female Skin: unremarkable HEENT: normocephalic, conjunctiva/corneas normal, sclerae anicteric, PERRLA, EOMi, nares patent, no discharge or erythema, pharynx normal Oral cavity: MMM, tongue normal, teeth in good repair Neck: supple, no lymphadenopathy, no thyromegaly, no masses, normal ROM, no bruits Chest: non tender, normal shape and expansion Heart: RRR, normal S1, S2, no murmurs Lungs: CTA bilaterally, no wheezes, rhonchi, or rales Abdomen: +bs, soft, non tender, non distended, no masses, no hepatomegaly, no splenomegaly, no bruits Back: non tender, normal ROM, no scoliosis Musculoskeletal: upper extremities non tender, no obvious deformity, normal ROM throughout, lower extremities non tender, no obvious deformity, normal ROM throughout Extremities: no edema, no cyanosis, no clubbing Pulses: 2+ symmetric, upper and lower extremities, normal cap refill Neurological: alert, oriented x 3, CN2-12 intact, strength normal upper extremities and lower extremities, sensation normal throughout, DTRs 2+ throughout, no cerebellar signs, gait normal Psychiatric: normal affect, behavior normal, pleasant  Breast/gyn/rectal - deferred to gynecology     Assessment and Plan :   Encounter Diagnoses  Name Primary?   Encounter for health maintenance examination in adult Yes   Needs flu shot    Vaccine counseling    Dysthymia    Osteopenia, unspecified location    History of CVA (cerebrovascular accident)    History of migraine headaches    Screening for heart disease    Screening for lipid disorders    Other urinary incontinence      This visit was a preventative care visit, also known as wellness visit or routine physical.   Topics typically include healthy lifestyle,  diet, exercise, preventative care, vaccinations, sick and well care, proper use of emergency dept and after hours care, as well as other concerns.    Separate significant issues discussed: History of migraines, history of remote stroke-we discussed her history.  She has been on nadolol long-term.  We discussed that if she feels fatigue on this medication we could consider other options.  This might be best done in conjunction with neurology consult or head imaging with MRI or at least ultrasound of carotids  Urinary incontinence in recent months-pending labs, consider other evaluation or treatment options for mild incontinence.  Consider Kegel exercises or referral for pelvic floor physical therapy  Dysthymia-continue current medication     General Recommendations: Continue to return yearly for your annual wellness and preventative care visits.  This gives [place] a chance to discuss healthy lifestyle, exercise, vaccinations, review your chart record, and perform screenings where appropriate.  I recommend you see your eye doctor yearly for routine vision  care.  I recommend you see your dentist yearly for routine dental care including hygiene visits twice yearly.   Vaccination recommendations were reviewed Immunization History  Administered Date(s) Administered   DTaP 04/14/2005   Influenza Split 03/20/2013, 03/09/2014, 07/04/2015   Influenza,inj,Quad PF,6+ Mos 02/21/2017, 02/23/2018, 02/27/2019, 03/13/2020, 03/23/2022   Influenza-Unspecified 03/11/2021   PFIZER Comirnaty(Gray Top)Covid-19 Tri-Sucrose Vaccine 12/16/2020, 03/23/2022   PFIZER(Purple Top)SARS-COV-2 Vaccination 08/30/2019, 09/24/2019, 04/08/2020   Tdap 06/14/2004, 04/03/2015   Zoster Recombinant(Shingrix) 02/27/2019, 03/21/2019, 05/21/2019    Counseled on the influenza virus vaccine.  Vaccine information sheet given.  Influenza vaccine given after consent obtained.    Screening for cancer: Colon cancer screening: Prior  or last colon cancer screen: 2021, due repeat 2026   Breast cancer screening: You should perform a self breast exam monthly.   We reviewed recommendations for regular mammograms and breast cancer screening.   Cervical cancer screening: We reviewed recommendations for pap smear screening.    Skin cancer screening: Check your skin regularly for new changes, growing lesions, or other lesions of concern Come in for evaluation if you have skin lesions of concern.  Lung cancer screening: If you have a greater than 20 pack year history of tobacco use, then you may qualify for lung cancer screening with a chest CT scan.   Please call your insurance company to inquire about coverage for this test.  Pancreatic cancer: no current screening test is available routinely recommended.  (Risk factors: Smoking, overweight or obese, diabetes, chronic pancreatitis, work Nurse, mental health, Solicitor, 55 year old or greater, female greater than female, African-American, family history of pancreatic cancer, hereditary breast, ovarian, melanoma, Lynch, Peutz-jeghers).  We currently don't have screenings for other cancers besides breast, cervical, colon, and lung cancers.  If you have a strong family history of cancer or have other cancer screening concerns, please let me know.    Bone health: Get at least 150 minutes of aerobic exercise weekly Get weight bearing exercise at least once weekly Bone density test:  A bone density test is an imaging test that uses a type of X-ray to measure the amount of calcium and other minerals in your bones. The test may be used to diagnose or screen you for a condition that causes weak or thin bones (osteoporosis), predict your risk for a broken bone (fracture), or determine how well your osteoporosis treatment is working. The bone density test is recommended for females 65 and older, or females or males <65 if certain risk factors such as thyroid disease, long term use  of steroids such as for asthma or rheumatological issues, vitamin D deficiency, estrogen deficiency, family history of osteoporosis, self or family history of fragility fracture in first degree relative.    Heart health: Get at least 150 minutes of aerobic exercise weekly Limit alcohol It is important to maintain a healthy blood pressure and healthy cholesterol numbers  Heart disease screening: Screening for heart disease includes screening for blood pressure, fasting lipids, glucose/diabetes screening, BMI height to weight ratio, reviewed of smoking status, physical activity, and diet.    Goals include blood pressure 120/80 or less, maintaining a healthy lipid/cholesterol profile, preventing diabetes or keeping diabetes numbers under good control, not smoking or using tobacco products, exercising most days per week or at least 150 minutes per week of exercise, and eating healthy variety of fruits and vegetables, healthy oils, and avoiding unhealthy food choices like fried food, fast food, high sugar and high cholesterol foods.    Other tests may possibly include EKG  test, CT coronary calcium score, echocardiogram, exercise treadmill stress test.    Consider CT coronary screen, $95 cash   Vascular disease screening: For high risk individuals including smokers, diabetes, patients with known heart disease or high blood pressure, kidney disease, and others, screening for vascular disease or atherosclerosis of the arteries is available.  Examples may include carotid ultrasound, abdominal aortic ultrasound, ABI blood flow screening in the legs, thoracic aorta screening.  Consider carotid ultrasound screen   Medical care options: I recommend you continue to seek care here first for routine care.  We try really hard to have available appointments Monday through Friday daytime hours for sick visits, acute visits, and physicals.  Urgent care should be used for after hours and weekends for  significant issues that cannot wait till the next day.  The emergency department should be used for significant potentially life-threatening emergencies.  The emergency department is expensive, can often have long wait times for less significant concerns, so try to utilize primary care, urgent care, or telemedicine when possible to avoid unnecessary trips to the emergency department.  Virtual visits and telemedicine have been introduced since the pandemic started in 2020, and can be convenient ways to receive medical care.  We offer virtual appointments as well to assist you in a variety of options to seek medical care.   Legal Take the time to do a Last Will and Testament, advanced directives including Healthcare Power of Attorney and Living Will documents.  Do not leave your family with burdens that can be handled ahead of time.   Advanced Directives: I recommend you consider completing a Health Care Power of Attorney and Living Will.   These documents respect your wishes and help alleviate burdens on your loved ones if you were to become terminally ill or be in a position to need those documents enforced.    You can complete Advanced Directives yourself, have them notarized, then have copies made for our office, for you and for anybody you feel should have them in safe keeping.  Or, you can have an attorney prepare these documents.   If you haven't updated your Last Will and Testament in a while, it may be worthwhile having an attorney prepare these documents together and save on some costs.       Spiritual and Emotional Health Keeping a healthy spiritual life can help you better manage your physical health. Your spiritual life can help you to cope with any issues that may arise with your physical health.  Balance can keep [place] healthy and help [place] to recover.  If you are struggling with your spiritual health there are questions that you may want to ask yourself:  What makes me feel most  complete? When do I feel most connected to the rest of the world? Where do I find the most inner strength? What am I doing when I feel whole?  Helpful tips: Being in nature. Some people feel very connected and at peace when they are walking outdoors or are outside. Helping others. Some feel the largest sense of wellbeing when they are of service to others. Being of service can take on many forms. It can be doing volunteer work, being kind to strangers, or offering a hand to a friend in need. Gratitude. Some people find they feel the most connected when they remain grateful. They may make lists of all the things they are grateful for or say a thank you out loud for all they have.  Emotional Health Are you in tune with your emotional health?  Check out this link: http://www.marquez-love.com/   Financial Health Make sure you use a budget for your personal finances Make sure you are insured against risks (health insurance, life insurance, auto insurance, etc) Save more, spend less Set financial goals If you need help in this area, good resources include counseling through [employer] or other community resources, have a meeting with a Social research officer, government, and a good resource is Salley Slaughter podcast    Jann was seen today for annual exam.  Diagnoses and all orders for this visit:  Encounter for health maintenance examination in adult -     POCT Urinalysis DIP (Proadvantage Device) -     VITAMIN D 25 Hydroxy (Vit-D Deficiency, Fractures) -     Comprehensive metabolic panel -     CBC -     Cancel: NMR, lipoprofile -     TSH -     NMR, lipoprofile  Needs flu shot -     Flu vaccine trivalent PF, 6mos and older(Flulaval,Afluria,Fluarix,Fluzone)  Vaccine counseling  Dysthymia -     TSH  Osteopenia, unspecified location -     VITAMIN D 25 Hydroxy (Vit-D Deficiency, Fractures)  History of CVA (cerebrovascular accident)  History of migraine  headaches  Screening for heart disease  Screening for lipid disorders -     Cancel: NMR, lipoprofile -     TSH -     NMR, lipoprofile  Other urinary incontinence    Follow-up pending labs, yearly for physical

## 2023-03-30 NOTE — Patient Instructions (Signed)
This visit was a preventative care visit, also known as wellness visit or routine physical.   Topics typically include healthy lifestyle, diet, exercise, preventative care, vaccinations, sick and well care, proper use of emergency dept and after hours care, as well as other concerns.    Separate significant issues discussed: History of migraines, history of remote stroke-we discussed her history.  She has been on nadolol long-term.  We discussed that if she feels fatigue on this medication we could consider other options.  This might be best done in conjunction with neurology consult or head imaging with MRI or at least ultrasound of carotids  Urinary incontinence in recent months-pending labs, consider other evaluation or treatment options for mild incontinence.  Consider Kegel exercises or referral for pelvic floor physical therapy  Dysthymia-continue current medication     General Recommendations: Continue to return yearly for your annual wellness and preventative care visits.  This gives [place] a chance to discuss healthy lifestyle, exercise, vaccinations, review your chart record, and perform screenings where appropriate.  I recommend you see your eye doctor yearly for routine vision care.  I recommend you see your dentist yearly for routine dental care including hygiene visits twice yearly.   Vaccination recommendations were reviewed Immunization History  Administered Date(s) Administered   DTaP 04/14/2005   Influenza Split 03/20/2013, 03/09/2014, 07/04/2015   Influenza,inj,Quad PF,6+ Mos 02/21/2017, 02/23/2018, 02/27/2019, 03/13/2020, 03/23/2022   Influenza-Unspecified 03/11/2021   PFIZER Comirnaty(Gray Top)Covid-19 Tri-Sucrose Vaccine 12/16/2020, 03/23/2022   PFIZER(Purple Top)SARS-COV-2 Vaccination 08/30/2019, 09/24/2019, 04/08/2020   Tdap 06/14/2004, 04/03/2015   Zoster Recombinant(Shingrix) 02/27/2019, 03/21/2019, 05/21/2019    Counseled on the influenza virus vaccine.   Vaccine information sheet given.  Influenza vaccine given after consent obtained.    Screening for cancer: Colon cancer screening: Prior or last colon cancer screen: 2021, due repeat 2026   Breast cancer screening: You should perform a self breast exam monthly.   We reviewed recommendations for regular mammograms and breast cancer screening.   Cervical cancer screening: We reviewed recommendations for pap smear screening.    Skin cancer screening: Check your skin regularly for new changes, growing lesions, or other lesions of concern Come in for evaluation if you have skin lesions of concern.  Lung cancer screening: If you have a greater than 20 pack year history of tobacco use, then you may qualify for lung cancer screening with a chest CT scan.   Please call your insurance company to inquire about coverage for this test.  Pancreatic cancer: no current screening test is available routinely recommended.  (Risk factors: Smoking, overweight or obese, diabetes, chronic pancreatitis, work Nurse, mental health, Solicitor, 49 year old or greater, female greater than female, African-American, family history of pancreatic cancer, hereditary breast, ovarian, melanoma, Lynch, Peutz-jeghers).  We currently don't have screenings for other cancers besides breast, cervical, colon, and lung cancers.  If you have a strong family history of cancer or have other cancer screening concerns, please let me know.    Bone health: Get at least 150 minutes of aerobic exercise weekly Get weight bearing exercise at least once weekly Bone density test:  A bone density test is an imaging test that uses a type of X-ray to measure the amount of calcium and other minerals in your bones. The test may be used to diagnose or screen you for a condition that causes weak or thin bones (osteoporosis), predict your risk for a broken bone (fracture), or determine how well your osteoporosis treatment is working. The  bone density test  is recommended for females 14 and older, or females or males <65 if certain risk factors such as thyroid disease, long term use of steroids such as for asthma or rheumatological issues, vitamin D deficiency, estrogen deficiency, family history of osteoporosis, self or family history of fragility fracture in first degree relative.    Heart health: Get at least 150 minutes of aerobic exercise weekly Limit alcohol It is important to maintain a healthy blood pressure and healthy cholesterol numbers  Heart disease screening: Screening for heart disease includes screening for blood pressure, fasting lipids, glucose/diabetes screening, BMI height to weight ratio, reviewed of smoking status, physical activity, and diet.    Goals include blood pressure 120/80 or less, maintaining a healthy lipid/cholesterol profile, preventing diabetes or keeping diabetes numbers under good control, not smoking or using tobacco products, exercising most days per week or at least 150 minutes per week of exercise, and eating healthy variety of fruits and vegetables, healthy oils, and avoiding unhealthy food choices like fried food, fast food, high sugar and high cholesterol foods.    Other tests may possibly include EKG test, CT coronary calcium score, echocardiogram, exercise treadmill stress test.    Consider CT coronary screen, $95 cash   Vascular disease screening: For high risk individuals including smokers, diabetes, patients with known heart disease or high blood pressure, kidney disease, and others, screening for vascular disease or atherosclerosis of the arteries is available.  Examples may include carotid ultrasound, abdominal aortic ultrasound, ABI blood flow screening in the legs, thoracic aorta screening.  Consider carotid ultrasound screen   Medical care options: I recommend you continue to seek care here first for routine care.  We try really hard to have available appointments Monday  through Friday daytime hours for sick visits, acute visits, and physicals.  Urgent care should be used for after hours and weekends for significant issues that cannot wait till the next day.  The emergency department should be used for significant potentially life-threatening emergencies.  The emergency department is expensive, can often have long wait times for less significant concerns, so try to utilize primary care, urgent care, or telemedicine when possible to avoid unnecessary trips to the emergency department.  Virtual visits and telemedicine have been introduced since the pandemic started in 2020, and can be convenient ways to receive medical care.  We offer virtual appointments as well to assist you in a variety of options to seek medical care.   Legal Take the time to do a Last Will and Testament, advanced directives including Healthcare Power of Attorney and Living Will documents.  Do not leave your family with burdens that can be handled ahead of time.   Advanced Directives: I recommend you consider completing a Health Care Power of Attorney and Living Will.   These documents respect your wishes and help alleviate burdens on your loved ones if you were to become terminally ill or be in a position to need those documents enforced.    You can complete Advanced Directives yourself, have them notarized, then have copies made for our office, for you and for anybody you feel should have them in safe keeping.  Or, you can have an attorney prepare these documents.   If you haven't updated your Last Will and Testament in a while, it may be worthwhile having an attorney prepare these documents together and save on some costs.       Spiritual and Emotional Health Keeping a healthy spiritual life can help you better manage  your physical health. Your spiritual life can help you to cope with any issues that may arise with your physical health.  Balance can keep [place] healthy and help [place] to recover.  If  you are struggling with your spiritual health there are questions that you may want to ask yourself:  What makes me feel most complete? When do I feel most connected to the rest of the world? Where do I find the most inner strength? What am I doing when I feel whole?  Helpful tips: Being in nature. Some people feel very connected and at peace when they are walking outdoors or are outside. Helping others. Some feel the largest sense of wellbeing when they are of service to others. Being of service can take on many forms. It can be doing volunteer work, being kind to strangers, or offering a hand to a friend in need. Gratitude. Some people find they feel the most connected when they remain grateful. They may make lists of all the things they are grateful for or say a thank you out loud for all they have.    Emotional Health Are you in tune with your emotional health?  Check out this link: http://www.marquez-love.com/   Financial Health Make sure you use a budget for your personal finances Make sure you are insured against risks (health insurance, life insurance, auto insurance, etc) Save more, spend less Set financial goals If you need help in this area, good resources include counseling through [employer] or other community resources, have a meeting with a Social research officer, government, and a good resource is [employer]

## 2023-03-31 ENCOUNTER — Other Ambulatory Visit: Payer: Self-pay | Admitting: Medical

## 2023-03-31 ENCOUNTER — Encounter: Payer: Commercial Managed Care - HMO | Admitting: Family Medicine

## 2023-03-31 DIAGNOSIS — F341 Dysthymic disorder: Secondary | ICD-10-CM

## 2023-03-31 LAB — COMPREHENSIVE METABOLIC PANEL
ALT: 16 IU/L (ref 0–32)
AST: 25 IU/L (ref 0–40)
Albumin: 4.5 g/dL (ref 3.9–4.9)
Alkaline Phosphatase: 76 IU/L (ref 44–121)
BUN/Creatinine Ratio: 10 — ABNORMAL LOW (ref 12–28)
BUN: 7 mg/dL — ABNORMAL LOW (ref 8–27)
Bilirubin Total: 1.1 mg/dL (ref 0.0–1.2)
CO2: 25 mmol/L (ref 20–29)
Calcium: 9.9 mg/dL (ref 8.7–10.3)
Chloride: 102 mmol/L (ref 96–106)
Creatinine, Ser: 0.68 mg/dL (ref 0.57–1.00)
Globulin, Total: 2.3 g/dL (ref 1.5–4.5)
Glucose: 87 mg/dL (ref 70–99)
Potassium: 4.2 mmol/L (ref 3.5–5.2)
Sodium: 140 mmol/L (ref 134–144)
Total Protein: 6.8 g/dL (ref 6.0–8.5)
eGFR: 97 mL/min/{1.73_m2} (ref >59)

## 2023-03-31 LAB — VITAMIN D 25 HYDROXY (VIT D DEFICIENCY, FRACTURES): Vit D, 25-Hydroxy: 61.3 ng/mL (ref 30.0–100.0)

## 2023-03-31 LAB — CBC
Hematocrit: 46.2 % (ref 34.0–46.6)
Hemoglobin: 15 g/dL (ref 11.1–15.9)
MCH: 32.2 pg (ref 26.6–33.0)
MCHC: 32.5 g/dL (ref 31.5–35.7)
MCV: 99 fL — ABNORMAL HIGH (ref 79–97)
Platelets: 321 10*3/uL (ref 150–450)
RBC: 4.66 x10E6/uL (ref 3.77–5.28)
RDW: 12.2 % (ref 11.7–15.4)
WBC: 5.6 10*3/uL (ref 3.4–10.8)

## 2023-03-31 LAB — TSH: TSH: 1.54 u[IU]/mL (ref 0.450–4.500)

## 2023-03-31 MED ORDER — FLUOXETINE HCL 20 MG PO CAPS: 20.0000 mg | ORAL_CAPSULE | Freq: Every day | ORAL | 3 refills | Status: DC

## 2023-03-31 NOTE — Progress Notes (Signed)
Results sent through MyChart

## 2023-04-01 LAB — NMR, LIPOPROFILE
Cholesterol, Total: 254 mg/dL — ABNORMAL HIGH (ref 100–199)
HDL Particle Number: 36.8 umol/L (ref >=30.5)
HDL-C: 57 mg/dL (ref >39)
LDL Particle Number: 2261 nmol/L — ABNORMAL HIGH (ref <1000)
LDL Size: 21.2 nmol (ref >20.5)
LDL-C (NIH Calc): 175 mg/dL — ABNORMAL HIGH (ref 0–99)
LP-IR Score: 50 — ABNORMAL HIGH (ref <=45)
Small LDL Particle Number: 1034 nmol/L — ABNORMAL HIGH (ref <=527)
Triglycerides: 123 mg/dL (ref 0–149)

## 2023-04-01 NOTE — Progress Notes (Signed)
Results sent through MyChart

## 2023-04-04 ENCOUNTER — Other Ambulatory Visit: Payer: Self-pay | Admitting: Medical

## 2023-04-04 DIAGNOSIS — E785 Hyperlipidemia, unspecified: Secondary | ICD-10-CM

## 2023-04-04 DIAGNOSIS — Z136 Encounter for screening for cardiovascular disorders: Secondary | ICD-10-CM

## 2023-04-04 MED ORDER — ROSUVASTATIN CALCIUM 10 MG PO TABS: 10.0000 mg | ORAL_TABLET | Freq: Every day | ORAL | 0 refills | Status: DC

## 2023-04-06 ENCOUNTER — Encounter: Payer: Self-pay | Admitting: Internal Medicine

## 2023-04-11 ENCOUNTER — Ambulatory Visit (HOSPITAL_COMMUNITY)
Admission: RE | Admit: 2023-04-11 | Discharge: 2023-04-11 | Disposition: A | Discharge status: Home / Self Care | Payer: Self-pay | Source: Ambulatory Visit | Attending: Medical | Admitting: Medical

## 2023-04-11 DIAGNOSIS — E785 Hyperlipidemia, unspecified: Secondary | ICD-10-CM | POA: Insufficient documentation

## 2023-04-11 DIAGNOSIS — Z136 Encounter for screening for cardiovascular disorders: Secondary | ICD-10-CM | POA: Insufficient documentation

## 2023-04-11 NOTE — Progress Notes (Signed)
Results sent through MyChart

## 2023-04-22 ENCOUNTER — Other Ambulatory Visit: Payer: Self-pay | Admitting: Medical

## 2023-04-22 DIAGNOSIS — Z8669 Personal history of other diseases of the nervous system and sense organs: Secondary | ICD-10-CM

## 2023-05-20 NOTE — Progress Notes (Signed)
See results.  Make sure her recent bone density and mammogram test that we got in from gynecology is abstracted.  These were electronically faxed in to the record

## 2023-05-26 ENCOUNTER — Other Ambulatory Visit: Payer: Self-pay | Admitting: Internal Medicine

## 2023-05-26 DIAGNOSIS — Z8669 Personal history of other diseases of the nervous system and sense organs: Secondary | ICD-10-CM

## 2023-05-26 MED ORDER — ROSUVASTATIN CALCIUM 10 MG PO TABS: 10.0000 mg | ORAL_TABLET | Freq: Every day | ORAL | 0 refills | Status: DC

## 2023-05-26 MED ORDER — NADOLOL 40 MG PO TABS: ORAL_TABLET | ORAL | 0 refills | Status: DC

## 2023-05-26 NOTE — Telephone Encounter (Signed)
done

## 2023-06-16 ENCOUNTER — Telehealth: Payer: Self-pay | Admitting: Medical

## 2023-06-16 DIAGNOSIS — Z8669 Personal history of other diseases of the nervous system and sense organs: Secondary | ICD-10-CM

## 2023-06-16 MED ORDER — NADOLOL 40 MG PO TABS: ORAL_TABLET | ORAL | 0 refills | Status: DC

## 2023-06-16 NOTE — Telephone Encounter (Signed)
 [facility]  Corgard 40 mg   Spoke to pt she is changing to mail order for this rx, not able to get locally

## 2023-06-16 NOTE — Telephone Encounter (Signed)
 done

## 2023-08-17 ENCOUNTER — Other Ambulatory Visit: Payer: Self-pay

## 2023-08-17 DIAGNOSIS — F341 Dysthymic disorder: Secondary | ICD-10-CM

## 2023-08-17 DIAGNOSIS — Z8669 Personal history of other diseases of the nervous system and sense organs: Secondary | ICD-10-CM

## 2023-08-17 MED ORDER — ROSUVASTATIN CALCIUM 10 MG PO TABS: 10.0000 mg | ORAL_TABLET | Freq: Every day | ORAL | 1 refills | Status: DC

## 2023-08-17 MED ORDER — FLUOXETINE HCL 20 MG PO CAPS: 20.0000 mg | ORAL_CAPSULE | Freq: Every day | ORAL | 1 refills | Status: DC

## 2023-08-17 MED ORDER — NADOLOL 40 MG PO TABS: ORAL_TABLET | ORAL | 1 refills | Status: DC

## 2023-09-05 DIAGNOSIS — Z1231 Encounter for screening mammogram for malignant neoplasm of breast: Secondary | ICD-10-CM | POA: Diagnosis not present

## 2023-09-05 LAB — HM MAMMOGRAPHY

## 2023-09-06 DIAGNOSIS — L814 Other melanin hyperpigmentation: Secondary | ICD-10-CM | POA: Diagnosis not present

## 2023-09-06 DIAGNOSIS — D485 Neoplasm of uncertain behavior of skin: Secondary | ICD-10-CM | POA: Diagnosis not present

## 2023-09-06 DIAGNOSIS — D2272 Melanocytic nevi of left lower limb, including hip: Secondary | ICD-10-CM | POA: Diagnosis not present

## 2023-09-06 DIAGNOSIS — D2262 Melanocytic nevi of left upper limb, including shoulder: Secondary | ICD-10-CM | POA: Diagnosis not present

## 2023-09-06 DIAGNOSIS — D225 Melanocytic nevi of trunk: Secondary | ICD-10-CM | POA: Diagnosis not present

## 2023-09-06 DIAGNOSIS — D2261 Melanocytic nevi of right upper limb, including shoulder: Secondary | ICD-10-CM | POA: Diagnosis not present

## 2023-09-06 DIAGNOSIS — D2271 Melanocytic nevi of right lower limb, including hip: Secondary | ICD-10-CM | POA: Diagnosis not present

## 2023-09-06 DIAGNOSIS — D3611 Benign neoplasm of peripheral nerves and autonomic nervous system of face, head, and neck: Secondary | ICD-10-CM | POA: Diagnosis not present

## 2023-09-06 DIAGNOSIS — L821 Other seborrheic keratosis: Secondary | ICD-10-CM | POA: Diagnosis not present

## 2023-09-09 LAB — HM PAP SMEAR

## 2023-10-25 ENCOUNTER — Other Ambulatory Visit: Payer: Self-pay | Admitting: Medical

## 2023-10-25 NOTE — Telephone Encounter (Signed)
Pt has an appt in October  

## 2023-11-16 ENCOUNTER — Other Ambulatory Visit: Payer: Self-pay | Admitting: Medical

## 2023-11-16 DIAGNOSIS — Z8669 Personal history of other diseases of the nervous system and sense organs: Secondary | ICD-10-CM

## 2023-12-07 ENCOUNTER — Other Ambulatory Visit: Payer: Self-pay | Admitting: Medical

## 2023-12-07 DIAGNOSIS — Z8669 Personal history of other diseases of the nervous system and sense organs: Secondary | ICD-10-CM

## 2023-12-12 ENCOUNTER — Ambulatory Visit (INDEPENDENT_AMBULATORY_CARE_PROVIDER_SITE_OTHER): Payer: PRIVATE HEALTH INSURANCE

## 2023-12-12 ENCOUNTER — Ambulatory Visit: Admitting: Podiatry

## 2023-12-12 ENCOUNTER — Encounter: Payer: Self-pay | Admitting: Podiatry

## 2023-12-12 VITALS — Ht 59.25 in | Wt 134.8 lb

## 2023-12-12 DIAGNOSIS — M2011 Hallux valgus (acquired), right foot: Secondary | ICD-10-CM

## 2023-12-12 DIAGNOSIS — M7751 Other enthesopathy of right foot: Secondary | ICD-10-CM

## 2023-12-12 DIAGNOSIS — M7752 Other enthesopathy of left foot: Secondary | ICD-10-CM | POA: Diagnosis not present

## 2023-12-12 DIAGNOSIS — M2012 Hallux valgus (acquired), left foot: Secondary | ICD-10-CM

## 2023-12-12 MED ORDER — BETAMETHASONE SOD PHOS & ACET 6 (3-3) MG/ML IJ SUSP
3.0000 mg | Freq: Once | INTRAMUSCULAR | Status: AC
Start: 2023-12-12 — End: 2023-12-12
  Administered 2023-12-12: 3 mg via INTRA_ARTICULAR

## 2023-12-12 NOTE — Progress Notes (Signed)
   Chief Complaint  Patient presents with   Foot Pain    Pt is here due to bilateral foot pain states the right foot is worst then the left, pain comes and goes, she usually handles it but going on vacation soon and would like some relief. Throbbing pain to the right side of the right foot.    HPI: 63 y.o. female presenting today for follow-up evaluation of pain and tenderness associated to the bilateral forefoot.  Long history of bunion deformity and tailor's bunion deformity bilateral.  She is leaving for vacation in 3 weeks and would like to receive injections to alleviate some of her symptoms since she will be doing a significant amount of walking  Past Medical History:  Diagnosis Date   Anxiety 1983   on occasion   Arthritis    some in hands   Asthma    childhood   Depression    on occasion   Encounter for health maintenance examination in adult 03/30/2023   Stroke [facility] 1989   Tubular adenoma of colon 01/16/2010   colonoscopy Dr. Kristie    Past Surgical History:  Procedure Laterality Date   COLONOSCOPY  2011   Dr. Shira reported   POLYPECTOMY  2011   Dr. Kristie -reports TA   WISDOM TOOTH EXTRACTION  1974    Allergies  Allergen Reactions   Codeine Nausea Only     Physical Exam: General: The patient is alert and oriented x3 in no acute distress.  Dermatology: Skin is warm, dry and supple bilateral lower extremities. Negative for open lesions or macerations.  Vascular: Palpable pedal pulses bilaterally. Capillary refill within normal limits.  Negative for any significant edema or erythema.  Clinically no concern for vascular compromise  Neurological: Light touch and protective threshold grossly intact  Musculoskeletal Exam: Hallux valgus and tailor's bunionette deformity noted bilateral.  Tenderness with palpation or range of motion of the fifth MTP bilateral   Radiographic exam B/L feet 12/12/2023: Normal osseous mineralization.  Hallux valgus deformity with  increased intermetatarsal angle and tailor's bunionette deformity is noted bilateral.  Bipartite sesamoid also noted to the left foot.  Assessment: 1.  Hallux valgus bilateral 2.  Tailor's bunionette deformity bilateral 3.  Fifth MTP capsulitis bilateral  -Patient evaluated.  X-rays reviewed -Injection of 0.5 cc Celestone  Soluspan injected in the fifth MTP bilateral -Recommend wide fitting shoes that do not irritate or constrict the toebox area -The patient has had pain and tenderness associated to her bunion and tailor's bunion deformities now for several years.  Progressively getting worse.  I do recommend surgery at one point since she continues to have pain and tenderness and conservative treatment has failed.  Surgery was discussed today including the postoperative recovery course.  She would like to consider bilateral foot surgery. -Return to clinic for surgical consult      Thresa EMERSON Sar, DPM [facility]  Dr. Thresa EMERSON Sar, DPM    [address] [place], [place] [address]                Office [phone]  Fax [phone]

## 2023-12-28 ENCOUNTER — Other Ambulatory Visit: Payer: Self-pay | Admitting: Medical

## 2023-12-28 DIAGNOSIS — Z8669 Personal history of other diseases of the nervous system and sense organs: Secondary | ICD-10-CM

## 2023-12-29 NOTE — Telephone Encounter (Signed)
 Patient has an appt in October. Ok to refill migraine medication?

## 2024-01-04 ENCOUNTER — Other Ambulatory Visit: Payer: Self-pay | Admitting: Medical

## 2024-01-04 DIAGNOSIS — F341 Dysthymic disorder: Secondary | ICD-10-CM

## 2024-01-04 NOTE — Telephone Encounter (Signed)
Pt has an appt in October  

## 2024-01-25 ENCOUNTER — Other Ambulatory Visit: Payer: Self-pay | Admitting: Medical

## 2024-01-25 DIAGNOSIS — H11422 Conjunctival edema, left eye: Secondary | ICD-10-CM | POA: Diagnosis not present

## 2024-01-25 DIAGNOSIS — H11442 Conjunctival cysts, left eye: Secondary | ICD-10-CM | POA: Diagnosis not present

## 2024-01-25 DIAGNOSIS — Z8669 Personal history of other diseases of the nervous system and sense organs: Secondary | ICD-10-CM

## 2024-02-07 DIAGNOSIS — M2391 Unspecified internal derangement of right knee: Secondary | ICD-10-CM | POA: Diagnosis not present

## 2024-03-30 ENCOUNTER — Other Ambulatory Visit: Payer: Self-pay | Admitting: Medical

## 2024-03-30 ENCOUNTER — Ambulatory Visit: Payer: 59 | Admitting: Medical

## 2024-03-30 ENCOUNTER — Encounter: Payer: Self-pay | Admitting: Medical

## 2024-03-30 VITALS — BP 110/70 | HR 60 | Ht 59.25 in | Wt 132.2 lb

## 2024-03-30 DIAGNOSIS — Z720 Tobacco use: Secondary | ICD-10-CM

## 2024-03-30 DIAGNOSIS — Z636 Dependent relative needing care at home: Secondary | ICD-10-CM | POA: Diagnosis not present

## 2024-03-30 DIAGNOSIS — Z8669 Personal history of other diseases of the nervous system and sense organs: Secondary | ICD-10-CM | POA: Diagnosis not present

## 2024-03-30 DIAGNOSIS — F341 Dysthymic disorder: Secondary | ICD-10-CM

## 2024-03-30 DIAGNOSIS — Z0000 Encounter for general adult medical examination without abnormal findings: Secondary | ICD-10-CM

## 2024-03-30 DIAGNOSIS — E785 Hyperlipidemia, unspecified: Secondary | ICD-10-CM

## 2024-03-30 DIAGNOSIS — M1990 Unspecified osteoarthritis, unspecified site: Secondary | ICD-10-CM | POA: Diagnosis not present

## 2024-03-30 DIAGNOSIS — Z23 Encounter for immunization: Secondary | ICD-10-CM

## 2024-03-30 DIAGNOSIS — Z860100 Personal history of colon polyps, unspecified: Secondary | ICD-10-CM

## 2024-03-30 DIAGNOSIS — Z8673 Personal history of transient ischemic attack (TIA), and cerebral infarction without residual deficits: Secondary | ICD-10-CM

## 2024-03-30 DIAGNOSIS — M8580 Other specified disorders of bone density and structure, unspecified site: Secondary | ICD-10-CM

## 2024-03-30 DIAGNOSIS — F325 Major depressive disorder, single episode, in full remission: Secondary | ICD-10-CM

## 2024-03-30 LAB — LIPID PANEL

## 2024-03-30 MED ORDER — FLUOXETINE HCL 20 MG PO CAPS: 20.0000 mg | ORAL_CAPSULE | Freq: Every day | ORAL | 3 refills | Status: DC

## 2024-03-30 MED ORDER — ROSUVASTATIN CALCIUM 10 MG PO TABS: 10.0000 mg | ORAL_TABLET | Freq: Every day | ORAL | 3 refills | Status: DC

## 2024-03-30 NOTE — Addendum Note (Signed)
 Addended by: BULAH ALM RAMAN on: 03/30/2024 05:08 PM   Modules accepted: Orders

## 2024-03-30 NOTE — Progress Notes (Signed)
 Subjective:   HPI  Theresa Mack is a 63 y.o. female who presents for Chief Complaint  Patient presents with   Annual Exam    Fasting cpe, no concerns    Patient Care Team: Sheritta Deeg, Alm GORMAN RIGGERS as PCP - General (Family Medicine) Dr. Thresa Sar, Dr. Pasco Ovens, podiatry Dr. Suzen Brass, GI Dr. Mat, [facility] Dr. Adina Pasco, dentist Dr. Ginnie Pinal, eye doctor [facility]   Concerns: Theresa Mack is a 63 year old female who presents for a routine follow-up visit.  She has a history of asthma, arthritis, stroke in 1989, and depression. She is currently taking Prozac  20 mg, Crestor  10 mg, Nadolol  40 mg once daily, a multivitamin, and vitamin D3 1000 units. She also takes Metamucil daily.  She experiences migraines once or twice a month, which are usually managed with Excedrin Migraine. She has been on Nadolol  for decades, which she feels helps manage her migraines. She had a CT head scan in 2022 following a fall.  Her mood is stable with Prozac , which she feels helps manage stress related to caregiving for her 58 year old mother. She describes her mood as generally good and believes the medication helps maintain this.  She had a bone density scan in January 2024, which showed osteopenia. A CT coronary calcium  test in October 2024 revealed a score of 121 with aortic atherosclerosis.  She occasionally smokes and avoids alcohol due to migraines. She exercises regularly, walking 40 minutes daily and performing stretches for about 45 minutes each morning. She uses resistance bands instead of weights due to arthritis in her hands.   Reviewed their medical, surgical, family, social, medication, and allergy history and updated chart as appropriate.  Allergies  Allergen Reactions   Codeine Nausea Only    Past Medical History:  Diagnosis Date   Anxiety 1983   on occasion   Arthritis    some in hands   Asthma    childhood   Depression     on occasion   Encounter for health maintenance examination in adult 03/30/2023   Hyperlipidemia 03/30/2024   Stroke (HCC) 1989   Tubular adenoma of colon 01/16/2010   colonoscopy Dr. Kristie     Current Outpatient Medications:    Cholecalciferol (D3) 25 MCG (1000 UT) capsule, Take 1,000 Units by mouth daily., Disp: , Rfl:    FLUoxetine  (PROZAC ) 20 MG capsule, TAKE 1 CAPSULE BY MOUTH DAILY, Disp: 90 capsule, Rfl: 0   Multiple Vitamins-Minerals (CENTRUM SILVER ADULT 50+) TABS, , Disp: , Rfl:    nadolol  (CORGARD ) 40 MG tablet, TAKE 1 TABLET BY MOUTH ONCE  DAILY, Disp: 90 tablet, Rfl: 3   rosuvastatin  (CRESTOR ) 10 MG tablet, TAKE 1 TABLET BY MOUTH DAILY, Disp: 100 tablet, Rfl: 1  Family History  Problem Relation Age of Onset   Arthritis Mother    Colon polyps Mother    Colon cancer Mother 3   Anxiety disorder Mother    Cancer Mother    Depression Mother    Hearing loss Mother    Stroke Father 49   Kidney disease Father 54   Heart attack Father 37   Diverticulitis Father 68   Arthritis Father    Heart disease Father    Heart disease Paternal Grandfather    Diabetes Paternal Grandmother    Hearing loss Paternal Grandmother    Heart disease Paternal Aunt    Varicose Veins Paternal Aunt    Esophageal cancer Neg Hx    Rectal  cancer Neg Hx    Stomach cancer Neg Hx     Past Surgical History:  Procedure Laterality Date   COLONOSCOPY  2011   Dr. Shira reported   COLONOSCOPY  01/2020   Dr. Suzen Brass, repeat 2026   POLYPECTOMY  2011   Dr. Kristie -reports TA   WISDOM TOOTH EXTRACTION  1974    Review of Systems  Constitutional:  Negative for chills, fever, malaise/fatigue and weight loss.  HENT:  Negative for congestion, ear pain, hearing loss, sore throat and tinnitus.   Eyes:  Negative for blurred vision, pain and redness.  Respiratory:  Negative for cough, hemoptysis and shortness of breath.   Cardiovascular:  Negative for chest pain, palpitations, orthopnea,  claudication and leg swelling.  Gastrointestinal:  Negative for abdominal pain, blood in stool, constipation, diarrhea, nausea and vomiting.  Genitourinary:  Negative for dysuria, flank pain, frequency, hematuria and urgency.  Musculoskeletal:  Positive for joint pain. Negative for falls and myalgias.  Skin:  Negative for itching and rash.  Neurological:  Positive for headaches. Negative for dizziness, tingling, speech change and weakness.  Endo/Heme/Allergies:  Negative for polydipsia. Does not bruise/bleed easily.  Psychiatric/Behavioral:  Negative for depression and memory loss. The patient is not nervous/anxious and does not have insomnia.         Objective:  BP 110/70   Pulse 60   Ht 4' 11.25 (1.505 m)   Wt 132 lb 3.2 oz (60 kg)   BMI 26.48 kg/m   General appearance: alert, no distress, WD/WN, Caucasian female Skin: unremarkable HEENT: normocephalic, conjunctiva/corneas normal, sclerae anicteric, PERRLA, EOMi, nares patent, no discharge or erythema, pharynx normal Oral cavity: MMM, tongue normal, teeth in good repair Neck: supple, no lymphadenopathy, no thyromegaly, no masses, normal ROM, no bruits Chest: non tender, normal shape and expansion Heart: RRR, normal S1, S2, no murmurs Lungs: CTA bilaterally, no wheezes, rhonchi, or rales Abdomen: +bs, soft, non tender, non distended, no masses, no hepatomegaly, no splenomegaly, no bruits Back: non tender, normal ROM, no scoliosis Musculoskeletal: Some mild to moderate bony arthritic changes of several DIPs and PIPs of both hands , upper extremities non tender, no obvious deformity, normal ROM throughout, lower extremities non tender, no obvious deformity, normal ROM throughout Extremities: no edema, no cyanosis, no clubbing Pulses: 2+ symmetric, upper and lower extremities, normal cap refill Neurological: alert, oriented x 3, CN2-12 intact, strength normal upper extremities and lower extremities, sensation normal throughout, DTRs  2+ throughout, no cerebellar signs, gait normal Psychiatric: normal affect, behavior normal, pleasant  Breast/gyn/rectal - deferred to gynecology     Assessment and Plan :   Encounter Diagnoses  Name Primary?   Encounter for health maintenance examination in adult Yes   Needs flu shot    Need for pneumococcal 20-valent conjugate vaccination    Osteopenia, unspecified location    History of colonic polyps    History of CVA (cerebrovascular accident)    History of migraine headaches    Caregiver burden    Hyperlipidemia, unspecified hyperlipidemia type    Osteoarthritis, unspecified osteoarthritis type, unspecified site    Depression, major, in remission    Occasional tobacco smoker      This visit was a preventative care visit, also known as wellness visit or routine physical.   Topics typically include healthy lifestyle, diet, exercise, preventative care, vaccinations, sick and well care, proper use of emergency dept and after hours care, as well as other concerns.     Separate significant issues discussed:  History of migraines, history of remote stroke-we discussed her history.  She has been on nadolol  long-term.  Doing fine on this.  Osteopenia-work on getting more weight bearing exercise  Arthritis - advised considering supplement such as fish oil or glucosamine chondroitin, stretching  Hyperlipidemia - continue crestor  10mg  daily  Depression in remission - continue prozac  20mg  daily  Advised she completely abstain from tobacco   General Recommendations: Continue to return yearly for your annual wellness and preventative care visits.  This gives us  a chance to discuss healthy lifestyle, exercise, vaccinations, review your chart record, and perform screenings where appropriate.  I recommend you see your eye doctor yearly for routine vision care.  I recommend you see your dentist yearly for routine dental care including hygiene visits twice yearly.   Vaccination  recommendations were reviewed Immunization History  Administered Date(s) Administered   DTaP 04/14/2005   INFLUENZA, HIGH DOSE SEASONAL PF 03/30/2024   Influenza Split 03/20/2013, 03/09/2014, 07/04/2015   Influenza, Seasonal, Injecte, Preservative Fre 03/30/2023   Influenza,inj,Quad PF,6+ Mos 02/21/2017, 02/23/2018, 02/27/2019, 03/13/2020, 03/23/2022   Influenza-Unspecified 03/11/2021   MMR 08/13/1965   PFIZER Comirnaty (Gray Top)Covid-19 Tri-Sucrose Vaccine 12/16/2020, 03/23/2022   PFIZER(Purple Top)SARS-COV-2 Vaccination 08/30/2019, 09/24/2019, 04/08/2020   PNEUMOCOCCAL CONJUGATE-20 03/30/2024   Tdap 06/14/2004, 06/14/2005, 04/03/2015   Unspecified SARS-COV-2 Vaccination 09/11/2021, 04/12/2023   Zoster Recombinant(Shingrix ) 02/27/2019, 03/21/2019, 05/21/2019   Zoster, Live 05/21/2019    Counseled on the influenza virus vaccine.  Influenza vaccine given after consent obtained.  Counseled on the pneumococcal vaccine.   Pneumococcal vaccine Prevnar 20 given after consent obtained.   Screening for cancer: Colon cancer screening: Prior or last colon cancer screen: 2021, due repeat 2026   Breast cancer screening: You should perform a self breast exam monthly.   We reviewed recommendations for regular mammograms and breast cancer screening.   Cervical cancer screening: We reviewed recommendations for pap smear screening.    Skin cancer screening: Check your skin regularly for new changes, growing lesions, or other lesions of concern Come in for evaluation if you have skin lesions of concern.  Lung cancer screening: If you have a greater than 20 pack year history of tobacco use, then you may qualify for lung cancer screening with a chest CT scan.   Please call your insurance company to inquire about coverage for this test.  Pancreatic cancer: no current screening test is available routinely recommended.  (Risk factors: Smoking, overweight or obese, diabetes, chronic  pancreatitis, work Nurse, mental health, Solicitor, 51 year old or greater, female greater than female, African-American, family history of pancreatic cancer, hereditary breast, ovarian, melanoma, Lynch, Peutz-jeghers).  We currently don't have screenings for other cancers besides breast, cervical, colon, and lung cancers.  If you have a strong family history of cancer or have other cancer screening concerns, please let me know.    Bone health: Get at least 150 minutes of aerobic exercise weekly Get weight bearing exercise at least once weekly Bone density test:  A bone density test is an imaging test that uses a type of X-ray to measure the amount of calcium  and other minerals in your bones. The test may be used to diagnose or screen you for a condition that causes weak or thin bones (osteoporosis), predict your risk for a broken bone (fracture), or determine how well your osteoporosis treatment is working. The bone density test is recommended for females 65 and older, or females or males <65 if certain risk factors such as thyroid  disease, long term use of steroids such  as for asthma or rheumatological issues, vitamin D  deficiency, estrogen deficiency, family history of osteoporosis, self or family history of fragility fracture in first degree relative.  Bone density January 2024 showing osteopenia   Heart health: Get at least 150 minutes of aerobic exercise weekly Limit alcohol It is important to maintain a healthy blood pressure and healthy cholesterol numbers  Heart disease screening: Screening for heart disease includes screening for blood pressure, fasting lipids, glucose/diabetes screening, BMI height to weight ratio, reviewed of smoking status, physical activity, and diet.    Goals include blood pressure 120/80 or less, maintaining a healthy lipid/cholesterol profile, preventing diabetes or keeping diabetes numbers under good control, not smoking or using tobacco products,  exercising most days per week or at least 150 minutes per week of exercise, and eating healthy variety of fruits and vegetables, healthy oils, and avoiding unhealthy food choices like fried food, fast food, high sugar and high cholesterol foods.    Other tests may possibly include EKG test, CT coronary calcium  score, echocardiogram, exercise treadmill stress test.    Consider CT coronary screen October 2024 showing score 121 and aortic atherosclerosis   Vascular disease screening: For high risk individuals including smokers, diabetes, patients with known heart disease or high blood pressure, kidney disease, and others, screening for vascular disease or atherosclerosis of the arteries is available.  Examples may include carotid ultrasound, abdominal aortic ultrasound, ABI blood flow screening in the legs, thoracic aorta screening.  Consider carotid ultrasound screen   Medical care options: I recommend you continue to seek care here first for routine care.  We try really hard to have available appointments Monday through Friday daytime hours for sick visits, acute visits, and physicals.  Urgent care should be used for after hours and weekends for significant issues that cannot wait till the next day.  The emergency department should be used for significant potentially life-threatening emergencies.  The emergency department is expensive, can often have long wait times for less significant concerns, so try to utilize primary care, urgent care, or telemedicine when possible to avoid unnecessary trips to the emergency department.  Virtual visits and telemedicine have been introduced since the pandemic started in 2020, and can be convenient ways to receive medical care.  We offer virtual appointments as well to assist you in a variety of options to seek medical care.   Legal Take the time to do a Last Will and Testament, advanced directives including Healthcare Power of Attorney and Living Will  documents.  Do not leave your family with burdens that can be handled ahead of time.   Advanced Directives: I recommend you consider completing a Health Care Power of Attorney and Living Will.   These documents respect your wishes and help alleviate burdens on your loved ones if you were to become terminally ill or be in a position to need those documents enforced.    You can complete Advanced Directives yourself, have them notarized, then have copies made for our office, for you and for anybody you feel should have them in safe keeping.  Or, you can have an attorney prepare these documents.   If you haven't updated your Last Will and Testament in a while, it may be worthwhile having an attorney prepare these documents together and save on some costs.       Shenita was seen today for annual exam.  Diagnoses and all orders for this visit:  Encounter for health maintenance examination in adult -  CBC -     Comprehensive metabolic panel with GFR -     Lipid panel -     Sedimentation rate  Needs flu shot -     Flu vaccine HIGH DOSE PF(Fluzone Trivalent)  Need for pneumococcal 20-valent conjugate vaccination -     Pneumococcal conjugate vaccine 20-valent (Prevnar 20)  Osteopenia, unspecified location  History of colonic polyps  History of CVA (cerebrovascular accident)  History of migraine headaches  Caregiver burden  Hyperlipidemia, unspecified hyperlipidemia type -     Lipid panel  Osteoarthritis, unspecified osteoarthritis type, unspecified site -     Sedimentation rate  Depression, major, in remission  Occasional tobacco smoker    Follow-up pending labs, yearly for physical

## 2024-03-31 LAB — LIPID PANEL
Cholesterol, Total: 157 mg/dL (ref 100–199)
HDL: 52 mg/dL (ref >39)
LDL CALC COMMENT:: 3 ratio (ref 0.0–4.4)
LDL Chol Calc (NIH): 88 mg/dL (ref 0–99)
Triglycerides: 94 mg/dL (ref 0–149)
VLDL Cholesterol Cal: 17 mg/dL (ref 5–40)

## 2024-03-31 LAB — COMPREHENSIVE METABOLIC PANEL WITH GFR
ALT: 20 IU/L (ref 0–32)
AST: 25 IU/L (ref 0–40)
Albumin: 4.7 g/dL (ref 3.9–4.9)
Alkaline Phosphatase: 69 IU/L (ref 49–135)
BUN/Creatinine Ratio: 16 (ref 12–28)
BUN: 11 mg/dL (ref 8–27)
Bilirubin Total: 1.4 mg/dL — AB (ref 0.0–1.2)
CO2: 21 mmol/L (ref 20–29)
Calcium: 9.6 mg/dL (ref 8.7–10.3)
Chloride: 106 mmol/L (ref 96–106)
Creatinine, Ser: 0.68 mg/dL (ref 0.57–1.00)
Globulin, Total: 2.1 g/dL (ref 1.5–4.5)
Glucose: 87 mg/dL (ref 70–99)
Potassium: 4.4 mmol/L (ref 3.5–5.2)
Sodium: 144 mmol/L (ref 134–144)
Total Protein: 6.8 g/dL (ref 6.0–8.5)
eGFR: 97 mL/min/1.73 (ref >59)

## 2024-03-31 LAB — CBC
Hematocrit: 45.2 % (ref 34.0–46.6)
Hemoglobin: 15 g/dL (ref 11.1–15.9)
MCH: 33.2 pg — ABNORMAL HIGH (ref 26.6–33.0)
MCHC: 33.2 g/dL (ref 31.5–35.7)
MCV: 100 fL — ABNORMAL HIGH (ref 79–97)
Platelets: 285 x10E3/uL (ref 150–450)
RBC: 4.52 x10E6/uL (ref 3.77–5.28)
RDW: 12.1 % (ref 11.7–15.4)
WBC: 5.7 x10E3/uL (ref 3.4–10.8)

## 2024-03-31 LAB — SEDIMENTATION RATE: Sed Rate: 6 mm/h (ref 0–40)

## 2024-04-01 ENCOUNTER — Ambulatory Visit: Payer: Self-pay | Admitting: Medical

## 2024-04-01 ENCOUNTER — Other Ambulatory Visit: Payer: Self-pay | Admitting: Medical

## 2024-04-01 NOTE — Progress Notes (Signed)
 Results thru my chart

## 2024-06-19 DIAGNOSIS — Z8669 Personal history of other diseases of the nervous system and sense organs: Secondary | ICD-10-CM

## 2024-06-19 DIAGNOSIS — F341 Dysthymic disorder: Secondary | ICD-10-CM

## 2024-06-19 MED ORDER — ROSUVASTATIN CALCIUM 10 MG PO TABS: 10.0000 mg | ORAL_TABLET | Freq: Every day | ORAL | 3 refills | Status: AC

## 2024-06-19 MED ORDER — NADOLOL 40 MG PO TABS: 40.0000 mg | ORAL_TABLET | Freq: Every day | ORAL | 3 refills | Status: AC

## 2024-06-19 MED ORDER — FLUOXETINE HCL 20 MG PO CAPS: 20.0000 mg | ORAL_CAPSULE | Freq: Every day | ORAL | 3 refills | Status: AC

## 2024-06-22 ENCOUNTER — Ambulatory Visit (INDEPENDENT_AMBULATORY_CARE_PROVIDER_SITE_OTHER): Payer: PRIVATE HEALTH INSURANCE | Admitting: Medical

## 2024-06-22 VITALS — BP 116/72 | HR 73 | Wt 134.0 lb

## 2024-06-22 DIAGNOSIS — M62830 Muscle spasm of back: Secondary | ICD-10-CM | POA: Diagnosis not present

## 2024-06-22 DIAGNOSIS — M25512 Pain in left shoulder: Secondary | ICD-10-CM

## 2024-06-22 DIAGNOSIS — M549 Dorsalgia, unspecified: Secondary | ICD-10-CM | POA: Diagnosis not present

## 2024-06-22 NOTE — Patient Instructions (Addendum)
" °  Left shoulder pain with impingement and possible mild arthritis Shoulder impingement with possible mild arthritis or bone spur. Conservative management appropriate due to good functional status. - Advised relative rest and limited shoulder activity for one week. - Suggested Tylenol 500 mg twice daily for pain relief as needed - Recommended Aleve over the counter, 1 or 2 tablets daily for 5-7 days for pain and inflammation. - Suggested topical treatments like Aspercreme or Voltaren gel. - Consider using a shoulder sleeve or arm sling for support. - Advised alternating heat and ice therapy as needed. - If no improvement in 1-2 weeks, consider follow up here with Dr. Vita, sports medicine "

## 2024-06-22 NOTE — Progress Notes (Signed)
 "  Name: Theresa Mack   Date of Visit: 06/22/2024   Date of last visit with me: 03/30/2024   CHIEF COMPLAINT:  Chief Complaint  Patient presents with   Acute Visit    Left shoulder pain, really tender to move, sometimes she will get pain down her arm. Been going on 3 weeks. Put voltaren gel on it and seems to help but then next day, it was back to hurting again       HPI:  Discussed the use of AI scribe software for clinical note transcription with the patient, who gave verbal consent to proceed.  History of Present Illness  Theresa Mack is a 64 year old female who presents with left shoulder pain persisting for weeks.  She has been experiencing left shoulder pain for several weeks without any known injury or fall. The pain is exacerbated by shoulder movement, particularly when lifting the arm up and down, and occasionally radiates down her arm. There is no associated numbness, tingling, or weakness.  She has been using Tylenol and Advil for pain relief and applying warm compresses, which she finds more comfortable than cold. She also tried Voltaren gel, which provided temporary relief. The pain does not significantly disturb her sleep, but she feels it when moving her arm while lying down.  She engages in light weight exercises using two-pound weights and performs shoulder rolls as part of her routine. She has not had any prior shoulder problems and has not undergone any imaging studies for her shoulder.  During the review of symptoms, she denies any swelling or neck pain. The pain is more pronounced when lifting her arm, particularly when reaching up or to the side, but is less bothersome when reaching across to the other shoulder.  She is right handed  No other aggravating or relieving factors. No other complaint.    ROS as in subjective  Past Medical History:  Diagnosis Date   Anxiety 1983   on occasion   Arthritis    some in hands   Asthma    childhood    Depression    on occasion   Encounter for health maintenance examination in adult 03/30/2023   Hyperlipidemia 03/30/2024   Stroke (HCC) 1989   Tubular adenoma of colon 01/16/2010   colonoscopy Dr. Kristie    Medications Ordered Prior to Encounter[1]    OBJECTIVE:    BP 116/72   Pulse 73   Wt 134 lb (60.8 kg)   BMI 26.84 kg/m   BP Readings from Last 3 Encounters:  06/22/24 116/72  03/30/24 110/70  03/30/23 122/70    Wt Readings from Last 3 Encounters:  06/22/24 134 lb (60.8 kg)  03/30/24 132 lb 3.2 oz (60 kg)  12/12/23 134 lb 12.8 oz (61.1 kg)    General appearence: alert, no distress, WD/WN,  Neck: nontender, normal ROM, no mass, no lad Left shoulder tender over ac joint, mild pain with ROM, but otherwise no swelling, no deformity, full ROM Mild tendnerss left upper back paraspinal and rhomboids, +spasm Arms neurovascularly intact    ASSESSMENT/PLAN:   Encounter Diagnoses  Name Primary?   Acute pain of left shoulder Yes   Back muscle spasm    Upper back pain      Left shoulder pain with impingement and possible mild arthritis Shoulder impingement with possible mild arthritis or bone spur. Conservative management appropriate due to good functional status. - Advised relative rest and limited shoulder activity for one week. - Suggested  Tylenol 500 mg twice daily for pain relief as needed - Recommended Aleve over the counter, 1 or 2 tablets daily for 5-7 days for pain and inflammation. - Suggested topical treatments like Aspercreme or Voltaren gel. - Consider using a shoulder sleeve or arm sling for support. - Advised alternating heat and ice therapy as needed. - If no improvement in 1-2 weeks, consider follow up here with Dr. Vita, sports medicine    Della was seen today for acute visit.  Diagnoses and all orders for this visit:  Acute pain of left shoulder  Back muscle spasm  Upper back pain   F/u prn   [facility]    [1]  Current Outpatient Medications on File Prior to Visit  Medication Sig Dispense Refill   Cholecalciferol (D3) 25 MCG (1000 UT) capsule Take 1,000 Units by mouth daily.     FLUoxetine  (PROZAC ) 20 MG capsule Take 1 capsule (20 mg total) by mouth daily. 100 capsule 3   Multiple Vitamins-Minerals (CENTRUM SILVER ADULT 50+) TABS      nadolol  (CORGARD ) 40 MG tablet Take 1 tablet (40 mg total) by mouth daily. 90 tablet 3   rosuvastatin  (CRESTOR ) 10 MG tablet Take 1 tablet (10 mg total) by mouth daily. 100 tablet 3   No current facility-administered medications on file prior to visit.   "

## 2025-04-01 ENCOUNTER — Encounter: Payer: Self-pay | Admitting: Medical
